# Patient Record
Sex: Female | Born: 1990 | Race: White | Hispanic: No | Smoking: Never smoker
Health system: Southern US, Community
[De-identification: ages and names within clinical notes are randomized; demographics above are authoritative.]

## PROBLEM LIST (undated history)

## (undated) DIAGNOSIS — F32A Depression, unspecified: Secondary | ICD-10-CM

## (undated) DIAGNOSIS — M5136 Other intervertebral disc degeneration, lumbar region: Secondary | ICD-10-CM

## (undated) DIAGNOSIS — O139 Gestational [pregnancy-induced] hypertension without significant proteinuria, unspecified trimester: Secondary | ICD-10-CM

## (undated) DIAGNOSIS — D332 Benign neoplasm of brain, unspecified: Secondary | ICD-10-CM

## (undated) DIAGNOSIS — G43909 Migraine, unspecified, not intractable, without status migrainosus: Secondary | ICD-10-CM

## (undated) DIAGNOSIS — K589 Irritable bowel syndrome without diarrhea: Secondary | ICD-10-CM

## (undated) DIAGNOSIS — M51369 Other intervertebral disc degeneration, lumbar region without mention of lumbar back pain or lower extremity pain: Secondary | ICD-10-CM

## (undated) DIAGNOSIS — F411 Generalized anxiety disorder: Secondary | ICD-10-CM

## (undated) DIAGNOSIS — O4590 Premature separation of placenta, unspecified, unspecified trimester: Secondary | ICD-10-CM

## (undated) DIAGNOSIS — M797 Fibromyalgia: Secondary | ICD-10-CM

## (undated) DIAGNOSIS — G4700 Insomnia, unspecified: Secondary | ICD-10-CM

## (undated) HISTORY — DX: Benign neoplasm of brain, unspecified: D33.2

## (undated) HISTORY — DX: Gestational (pregnancy-induced) hypertension without significant proteinuria, unspecified trimester: O13.9

## (undated) HISTORY — PX: WISDOM TOOTH EXTRACTION: SHX21

## (undated) HISTORY — PX: BACK SURGERY: SHX140

## (undated) HISTORY — DX: Premature separation of placenta, unspecified, unspecified trimester: O45.90

## (undated) HISTORY — PX: TUBAL LIGATION: SHX77

## (undated) HISTORY — DX: Migraine, unspecified, not intractable, without status migrainosus: G43.909

---
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
## 2012-06-22 ENCOUNTER — Encounter: Payer: Self-pay | Admitting: Cardiovascular Disease

## 2012-06-22 ENCOUNTER — Encounter: Payer: Self-pay | Admitting: *Deleted

## 2012-06-23 ENCOUNTER — Other Ambulatory Visit: Payer: Self-pay | Admitting: *Deleted

## 2012-06-23 ENCOUNTER — Encounter: Payer: Self-pay | Admitting: Cardiovascular Disease

## 2012-06-23 ENCOUNTER — Other Ambulatory Visit (HOSPITAL_COMMUNITY): Payer: Self-pay | Admitting: Family Medicine

## 2012-06-23 ENCOUNTER — Ambulatory Visit (INDEPENDENT_AMBULATORY_CARE_PROVIDER_SITE_OTHER): Payer: BC Managed Care – PPO | Admitting: Cardiovascular Disease

## 2012-06-23 ENCOUNTER — Ambulatory Visit (HOSPITAL_COMMUNITY): Payer: BC Managed Care – PPO | Attending: Family Medicine | Admitting: Radiology

## 2012-06-23 VITALS — BP 134/96 | HR 83 | Ht 70.0 in | Wt 185.4 lb

## 2012-06-23 DIAGNOSIS — R011 Cardiac murmur, unspecified: Secondary | ICD-10-CM

## 2012-06-23 NOTE — Progress Notes (Signed)
Echocardiogram performed.  

## 2012-06-23 NOTE — Progress Notes (Signed)
Patient ID: Heidi Gibbs, female   DOB: January 02, 1991, 21 y.o.   MRN: [id number] 21 yo with recent febrile illness On antibiotics till Monday.  PA at [facility] heard murmur and referred. No history of rheumatic fever, scarlet fever.  Tested negative for mono.  No history of congenital heart disease or murmur.  No diet pills or fen-phen.  Fever has resolved ? Sinusitis.  No rash No stigmata of SBE.  She denies dyspnea, palpitations, or chest pain.  I have no lab work on her but she denies pregnancy, anemia or thyroid disease.  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    ROS: Denies fever, malais, weight loss, blurry vision, decreased visual acuity, cough, sputum, SOB, hemoptysis, pleuritic pain, palpitaitons, heartburn, abdominal pain, melena, lower extremity edema, claudication, or rash.  All other systems reviewed and negative   General: Affect appropriate Healthy:  appears stated age HEENT: normal Neck supple with no adenopathy JVP normal no bruits no thyromegaly Lungs clear with no wheezing and good diaphragmatic motion Heart:  S1/S2 no murmur,rub, gallop or click PMI normal Abdomen: benighn, BS positve, no tenderness, no AAA no bruit.  No HSM or HJR Distal pulses intact with no bruits No edema Neuro non-focal Skin warm and dry No muscular weakness  Medications Current Outpatient Prescriptions  Medication Sig Dispense Refill  . MELATONIN FAST MELTZ PO Take by mouth.      . Multiple Vitamin (MULTIVITAMIN WITH MINERALS) TABS Take 1 tablet by mouth daily.      . norethindrone-ethinyl estradiol-iron (ESTROSTEP FE,TILIA FE,TRI-LEGEST FE) 1-20/1-30/1-35 MG-MCG tablet Take 1 tablet by mouth daily.      . vitamin C (ASCORBIC ACID) 500 MG tablet Take 500 mg by mouth daily.        Allergies Review of patient's allergies indicates no known allergies.  Family History: History reviewed. No pertinent family history.  Social History: History   Social History  . Marital Status: Single    Spouse  Name: N/A    Number of Children: N/A  . Years of Education: N/A   Occupational History  . Not on file.   Social History Main Topics  . Smoking status: Never Smoker   . Smokeless tobacco: Not on file  . Alcohol Use: Not on file  . Drug Use: Not on file  . Sexually Active: Not on file   Other Topics Concern  . Not on file   Social History Narrative  . No narrative on file    Electrocardiogram:  NSR rate 79 normal  Assessment and Plan

## 2012-06-23 NOTE — Assessment & Plan Note (Signed)
She has no murmur today.  Reviewed echo and she has trivial MR with no prolapse.  Doubt rheumatic fever.  Advised f/u echo in 2 years or sooner if murmur "comes" back or heard again.  Possibility of flow murmur heard during febrile illness

## 2012-06-23 NOTE — Patient Instructions (Addendum)
Your physician recommends that you schedule a follow-up appointment in: as needed Your physician recommends that you continue on your current medications as directed. Please refer to the Current Medication list given to you today.   

## 2012-06-27 ENCOUNTER — Encounter (HOSPITAL_COMMUNITY): Payer: Self-pay | Admitting: Family Medicine

## 2012-10-06 ENCOUNTER — Telehealth: Payer: Self-pay | Admitting: *Deleted

## 2012-10-06 MED ORDER — NORGESTIM-ETH ESTRAD TRIPHASIC 0.18/0.215/0.25 MG-35 MCG PO TABS: 1.0000 | ORAL_TABLET | Freq: Every day | ORAL | Status: DC

## 2012-10-06 NOTE — Telephone Encounter (Signed)
RX sent to [facility]

## 2012-10-06 NOTE — Telephone Encounter (Signed)
Pt needs refill on OCP. Could you please help me find in database to refill?  Will send chart back. Thanks

## 2012-10-09 ENCOUNTER — Telehealth: Payer: Self-pay | Admitting: Family Medicine

## 2012-10-09 NOTE — Telephone Encounter (Signed)
appt made for 4/8- pm clinic

## 2012-10-09 NOTE — Telephone Encounter (Signed)
Pt aware rx sent to [facility]

## 2012-10-10 ENCOUNTER — Ambulatory Visit (INDEPENDENT_AMBULATORY_CARE_PROVIDER_SITE_OTHER): Payer: BC Managed Care – PPO | Admitting: Nurse Practitioner

## 2012-10-10 VITALS — BP 131/83 | HR 106 | Temp 97.9°F | Ht 68.0 in | Wt 185.0 lb

## 2012-10-10 DIAGNOSIS — G44209 Tension-type headache, unspecified, not intractable: Secondary | ICD-10-CM

## 2012-10-10 DIAGNOSIS — R51 Headache: Secondary | ICD-10-CM

## 2012-10-10 MED ORDER — KETOROLAC TROMETHAMINE 60 MG/2ML IM SOLN
60.0000 mg | Freq: Once | INTRAMUSCULAR | Status: AC
  Administered 2012-10-10: 60 mg via INTRAMUSCULAR

## 2012-10-10 MED ORDER — BUTALBITAL-APAP-CAFF-COD 50-325-40-30 MG PO CAPS: 1.0000 | ORAL_CAPSULE | ORAL | Status: DC | PRN

## 2012-10-10 NOTE — Progress Notes (Deleted)
Error entry

## 2012-10-10 NOTE — Patient Instructions (Signed)
Tension Headache  A tension headache is a feeling of pain, pressure, or aching often felt over the front and sides of the head. The pain can be dull or can feel tight (constricting). It is the most common type of headache. Tension headaches are not normally associated with nausea or vomiting and do not get worse with physical activity. Tension headaches can last 30 minutes to several days.   CAUSES   The exact cause is not known, but it may be caused by chemicals and hormones in the brain that lead to pain. Tension headaches often begin after stress, anxiety, or depression. Other triggers may include:   Alcohol.   Caffeine (too much or withdrawal).   Respiratory infections (colds, flu, sinus infections).   Dental problems or teeth clenching.   Fatigue.   Holding your head and neck in one position too long while using a computer.  SYMPTOMS    Pressure around the head.    Dull, aching head pain.    Pain felt over the front and sides of the head.    Tenderness in the muscles of the head, neck, and shoulders.  DIAGNOSIS   A tension headache is often diagnosed based on:    Symptoms.    Physical examination.    A CT scan or MRI of your head. These tests may be ordered if symptoms are severe or unusual.  TREATMENT   Medicines may be given to help relieve symptoms.   HOME CARE INSTRUCTIONS    Only take over-the-counter or prescription medicines for pain or discomfort as directed by your caregiver.    Lie down in a dark, quiet room when you have a headache.    Keep a journal to find out what may be triggering your headaches. For example, write down:   What you eat and drink.   How much sleep you get.   Any change to your diet or medicines.   Try massage or other relaxation techniques.    Ice packs or heat applied to the head and neck can be used. Use these 3 to 4 times per day for 15 to 20 minutes each time, or as needed.    Limit stress.    Sit up straight, and do not tense your muscles.     Quit smoking if you smoke.   Limit alcohol use.   Decrease the amount of caffeine you drink, or stop drinking caffeine.   Eat and exercise regularly.   Get 7 to 9 hours of sleep, or as recommended by your caregiver.   Avoid excessive use of pain medicine as recurrent headaches can occur.   SEEK MEDICAL CARE IF:    You have problems with the medicines you were prescribed.   Your medicines do not work.   You have a change from the usual headache.   You have nausea or vomiting.  SEEK IMMEDIATE MEDICAL CARE IF:    Your headache becomes severe.   You have a fever.   You have a stiff neck.   You have loss of vision.   You have muscular weakness or loss of muscle control.   You lose your balance or have trouble walking.   You feel faint or pass out.   You have severe symptoms that are different from your first symptoms.  MAKE SURE YOU:    Understand these instructions.   Will watch your condition.   Will get help right away if you are not doing well or get worse.  

## 2012-10-10 NOTE — Progress Notes (Signed)
  Subjective:    Patient ID: Heidi Gibbs, female    DOB: 20-Jul-1990, 20 y.o.   MRN: [id number]  HPI-Patient in complaining of headaches . Started 2week. Has gotten worse since started. Associated symptoms include nausea and light sensativity. He has tried excerdin OTC without relief. Pate int has always had frequent headaches but not daily like she is having now. Hurts across front of head and describes it as pounding. Dull ache in back of head. Patient says she only has coffee 1X/week. Rates pain 7/10 at its worse.     Review of Systems  Constitutional: Negative.   Eyes: Positive for photophobia.  Respiratory: Negative.   Cardiovascular: Negative.   Gastrointestinal: Positive for nausea.  Genitourinary: Negative.   Neurological: Positive for headaches.       Objective:   Physical Exam  Constitutional: She is oriented to person, place, and time. She appears well-developed and well-nourished.  HENT:  Head: Normocephalic.  Nose: Nose normal.  Mouth/Throat: Oropharynx is clear and moist.  Eyes: Conjunctivae and EOM are normal. Pupils are equal, round, and reactive to light.  Neck: Normal range of motion. Neck supple.  Cardiovascular: Normal rate, normal heart sounds and intact distal pulses.   No murmur heard. Pulmonary/Chest: Effort normal and breath sounds normal.  Abdominal: Soft. Bowel sounds are normal.  Neurological: She is alert and oriented to person, place, and time. She has normal reflexes. No cranial nerve deficit.  Skin: Skin is warm and dry.  Psychiatric: She has a normal mood and affect. Her behavior is normal. Judgment and thought content normal.          Assessment & Plan:  Headache/tension headache  Toradol Inj  Fioricet with codeine  Force fluids  Rest  Keep HA diary  RTO prn  Mary-Margaret Daphine Deutscher, FNP

## 2012-10-12 ENCOUNTER — Other Ambulatory Visit: Payer: Self-pay | Admitting: *Deleted

## 2012-11-06 ENCOUNTER — Ambulatory Visit (INDEPENDENT_AMBULATORY_CARE_PROVIDER_SITE_OTHER): Payer: BC Managed Care – PPO | Admitting: General Practice

## 2012-11-06 VITALS — BP 148/86 | HR 78 | Temp 98.7°F | Ht 68.0 in | Wt 200.0 lb

## 2012-11-06 DIAGNOSIS — B019 Varicella without complication: Secondary | ICD-10-CM

## 2012-11-06 LAB — POCT CBC
HCT, POC: 38.6 % (ref 37.7–47.9)
Lymph, poc: 1.3 (ref 0.6–3.4)
MCHC: 37 g/dL — AB (ref 31.8–35.4)
MCV: 83.4 fL (ref 80–97)
MPV: 7.7 fL (ref 0–99.8)
POC Granulocyte: 3.3 (ref 2–6.9)
POC LYMPH PERCENT: 25.5 %L (ref 10–50)
WBC: 5 10*3/uL (ref 4.6–10.2)

## 2012-11-06 MED ORDER — FAMCICLOVIR 500 MG PO TABS: 500.0000 mg | ORAL_TABLET | Freq: Three times a day (TID) | ORAL | Status: DC

## 2012-11-06 NOTE — Progress Notes (Signed)
  Subjective:    Patient ID: Heidi Gibbs, female    DOB: Apr 08, 1991, 22 y.o.   MRN: [id number]  Rash This is a new problem. Episode onset: Friday. The problem has been gradually worsening since onset. The affected locations include the torso, left arm, right arm, right upper leg and right lower leg. The rash is characterized by blistering, itchiness and redness. She was exposed to nothing. Associated symptoms include a fever. Pertinent negatives include no shortness of breath. (101 maximum) Treatments tried: tylenol and motrin. The treatment provided mild relief. Her past medical history is significant for eczema. There is no history of allergies, asthma or varicella.  Fever  Associated symptoms include headaches and a rash. Pertinent negatives include no chest pain.      Review of Systems  Constitutional: Positive for fever.  HENT: Negative for mouth sores, neck pain and neck stiffness.   Respiratory: Negative for chest tightness and shortness of breath.   Cardiovascular: Negative for chest pain.  Gastrointestinal: Negative for abdominal distention.  Genitourinary: Negative for difficulty urinating.  Musculoskeletal: Positive for myalgias and back pain.       Since saturday  Skin: Positive for rash.  Neurological: Positive for dizziness and headaches.       Since Saturday night intermittently    Psychiatric/Behavioral: Negative.        Objective:   Physical Exam  Constitutional: She is oriented to person, place, and time. She appears well-developed and well-nourished.  HENT:  Head: Normocephalic and atraumatic.  Right Ear: External ear normal.  Left Ear: External ear normal.  Cardiovascular: Normal rate, regular rhythm, normal heart sounds and intact distal pulses.   No murmur heard. Pulmonary/Chest: Effort normal and breath sounds normal. No respiratory distress. She exhibits no tenderness.  Abdominal: Soft. Bowel sounds are normal.  Neurological: She is alert and  oriented to person, place, and time.  Skin: Skin is warm and dry. Lesion and rash noted. Rash is macular, papular and vesicular. There is erythema.  Psychiatric: She has a normal mood and affect.          Assessment & Plan:  Take medications as prescribed Cultures pending Keep fingernails clean and short Do not scratch  RTO if symptoms worsen Refrain from immunocompromised person, elderly, and children Patient verbalized understanding Raymon Mutton, FNP-C

## 2012-11-06 NOTE — Patient Instructions (Addendum)
Chickenpox in Adults Chickenpox is an illness caused by a virus. This virus can spread easily from one person to another. Those with chickenpox almost never get it more than once. While it usually strikes children, adults who have never had the illness or vaccine can get chickenpox. In children, the illness is reasonably mild, although annoying. In adults, it can be very serious. CAUSES  A varicella-zoster virus causes chickenpox. The virus is passed in tiny droplets that the infected person coughs or sneezes into the air. Chickenpox can also be passed when someone comes into contact with the fluid produced by the chickenpox rash. When someone has been exposed to chickenpox, he or she usually comes down with the illness within about 10 to 21 days.  SYMPTOMS  The illness usually starts with:  Body aches and pain.  Headache.  Irritability.  Tiredness.  Fever.  Sore throat. A day or two later, a rash develops. The rash is made up of very itchy blisters. The rash lasts about 5 to 7 days. Each "chicken pock" heals over with a crusty scab. Chickenpox is very serious illness in adults. There is a higher risk of complications, including:  Pneumonia.  Skin infection.  Bone infection (osteomyelitis).  Joint infection (septic arthritis).  Brain infection (encephalitis).  Toxic shock syndrome.  Bleeding problems.  Problems with balance and muscle control (cerebellar ataxia).  Death. Women who get chickenpox during pregnancy have a higher risk of having a baby with birth defects. DIAGNOSIS  A diagnosis is based on the presence of the usual symptoms of achiness and fever along with the characteristic rash. If there is any question, blood tests can be done to diagnose the infection. TREATMENT  Treatment for chickenpox may include:  Taking the anti-viral drug acyclovir to shorten the length of the illness and to decrease its severity. The drug has to be started within 24 hours of  symptoms.  Taking medicine as directed by your caregiver.  Applying calamine lotion to decrease itchiness.  Baking soda or oatmeal baths to soothe itchy skin. When a person who has never had chickenpox has been exposed to the virus (especially a pregnant woman or a patient with HIV or AIDS) they might benefit from a shot of varicella-zoster immune globulin (VZIG). This helps prevent the person from actually coming down with the illness. VZIG must be given within 72 hours of exposure to the virus. HOME CARE INSTRUCTIONS   Only take over-the-counter or prescriptions medicines for pain, discomfort, or fever as directed by your caregiver.  Try taking a lukewarm (not hot) bath every few hours. Adding several tablespoons of baking soda or oatmeal may help make the bath more soothing.  Ice packs or cold washcloths applied to the rash may help improve itching.  Ask your caregiver if you may use an over-the-counter antihistamine (such as diphenhydramine) to decrease itching.  Wash your hands often. This helps lower the risk of a bacterial skin infection, as well as passing the virus to others. If you can, use alcohol-based rubs or wipes. If you cannot get these, use regular soap and water.  If you have blisters in your mouth, do not eat or drink spicy, salty, or acidic things. Soft, bland, cold foods and beverages will feel best.  Avoid people who have not had chickenpox or women who are pregnant. SEEK IMMEDIATE MEDICAL CARE IF:   You have a hard time breathing.  You have a severe headache.  You have a stiff neck.  You have severe   joint pain or stiffness.  You feel disoriented or confused.  You are having trouble walking or keeping your balance.  You have an oral temperature above 102 F (38.9 C).  The area around one of the chickenpox becomes very red, hot to the touch, painful, or leaks pus. Document Released: 03/30/2008 Document Revised: 09/13/2011 Document Reviewed:  03/30/2008 Exit[employer] Patient Information 2013 [employer].  

## 2012-11-07 LAB — VARICELLA ZOSTER ANTIBODY, IGG

## 2012-11-20 LAB — VIRAL CULTURE VIRC: Organism ID, Bacteria: NEGATIVE

## 2012-12-29 ENCOUNTER — Other Ambulatory Visit: Payer: Self-pay | Admitting: General Practice

## 2012-12-29 ENCOUNTER — Telehealth: Payer: Self-pay | Admitting: General Practice

## 2012-12-29 ENCOUNTER — Ambulatory Visit (INDEPENDENT_AMBULATORY_CARE_PROVIDER_SITE_OTHER): Payer: BC Managed Care – PPO | Admitting: General Practice

## 2012-12-29 ENCOUNTER — Ambulatory Visit (HOSPITAL_COMMUNITY)
Admission: RE | Admit: 2012-12-29 | Discharge: 2012-12-29 | Disposition: A | Discharge status: Home / Self Care | Payer: BC Managed Care – PPO | Source: Ambulatory Visit | Attending: General Practice | Admitting: General Practice

## 2012-12-29 VITALS — BP 114/62 | HR 76 | Temp 97.4°F | Ht 69.0 in | Wt 180.0 lb

## 2012-12-29 DIAGNOSIS — H6120 Impacted cerumen, unspecified ear: Secondary | ICD-10-CM

## 2012-12-29 DIAGNOSIS — R42 Dizziness and giddiness: Secondary | ICD-10-CM | POA: Insufficient documentation

## 2012-12-29 DIAGNOSIS — R112 Nausea with vomiting, unspecified: Secondary | ICD-10-CM | POA: Insufficient documentation

## 2012-12-29 DIAGNOSIS — H6123 Impacted cerumen, bilateral: Secondary | ICD-10-CM

## 2012-12-29 LAB — POCT CBC
Granulocyte percent: 78.7 %G (ref 37–80)
HCT, POC: 41.7 % (ref 37.7–47.9)
Lymph, poc: 2 (ref 0.6–3.4)
MCH, POC: 29.4 pg (ref 27–31.2)
MCHC: 34.5 g/dL (ref 31.8–35.4)
MCV: 85.2 fL (ref 80–97)
POC Granulocyte: 9.3 — AB (ref 2–6.9)
POC LYMPH PERCENT: 16.8 %L (ref 10–50)
RDW, POC: 13.5 %
WBC: 11.8 10*3/uL — AB (ref 4.6–10.2)

## 2012-12-29 LAB — POCT URINALYSIS DIPSTICK
Bilirubin, UA: NEGATIVE
Ketones, UA: NEGATIVE
Nitrite, UA: NEGATIVE
Spec Grav, UA: <=1.005

## 2012-12-29 LAB — POCT UA - MICROSCOPIC ONLY
Crystals, Ur, HPF, POC: NEGATIVE
Mucus, UA: NEGATIVE
Yeast, UA: NEGATIVE

## 2012-12-29 LAB — POCT URINE PREGNANCY: Preg Test, Ur: NEGATIVE

## 2012-12-29 MED ORDER — ONDANSETRON HCL 4 MG PO TABS: 4.0000 mg | ORAL_TABLET | Freq: Three times a day (TID) | ORAL | Status: DC | PRN

## 2012-12-29 MED ORDER — PROMETHAZINE HCL 25 MG/ML IJ SOLN
25.0000 mg | Freq: Once | INTRAMUSCULAR | Status: AC
Start: 2012-12-29 — End: 2012-12-29
  Administered 2012-12-29: 25 mg via INTRAMUSCULAR

## 2012-12-29 MED ORDER — AMOXICILLIN 500 MG PO CAPS: 500.0000 mg | ORAL_CAPSULE | Freq: Two times a day (BID) | ORAL | Status: DC

## 2012-12-29 MED ORDER — BUTALBITAL-APAP-CAFF-COD 50-325-40-30 MG PO CAPS: 1.0000 | ORAL_CAPSULE | ORAL | Status: DC | PRN

## 2012-12-29 NOTE — Progress Notes (Signed)
Subjective:    Patient ID: Heidi Gibbs, female    DOB: June 27, 1991, 22 y.o.   MRN: [id number]  HPI Presents today complaining of nausea, vomiting, and headache. She reports having a history of migraines. She denies taking migraine medications this am. She reports vomiting four times this am.  Reports starting menstrual cycle on yesterday. Reports waking up this morning with dizziness, then started vomiting after being up for 30 minutes.    Review of Systems  Constitutional: Negative for fever and chills.  HENT: Negative for ear pain, congestion, rhinorrhea, neck pain, neck stiffness and postnasal drip.   Eyes: Negative for photophobia and visual disturbance.  Respiratory: Negative for chest tightness, shortness of breath and wheezing.   Cardiovascular: Negative for chest pain and palpitations.  Gastrointestinal: Negative for abdominal pain.  Genitourinary: Negative for dysuria, hematuria and difficulty urinating.  Musculoskeletal: Positive for back pain.  Neurological: Positive for dizziness, weakness and headaches.       Objective:   Physical Exam  Constitutional: She appears well-developed and well-nourished.  HENT:  Head: Normocephalic and atraumatic.  Cerumen noted to bilateral ear canal, TM not visible.   Cardiovascular: Normal rate, regular rhythm and normal heart sounds.   Pulmonary/Chest: Effort normal and breath sounds normal. No respiratory distress. She exhibits no tenderness.  Abdominal: Soft. Bowel sounds are normal. She exhibits no distension. There is no tenderness.  Skin: Skin is warm and dry.  Psychiatric: She has a normal mood and affect.   Results for orders placed in visit on 12/29/12  POCT URINALYSIS DIPSTICK      Result Value Range   Color, UA yellow     Clarity, UA cloudy     Glucose, UA neg     Bilirubin, UA neg     Ketones, UA neg     Spec Grav, UA <=1.005     Blood, UA mod     pH, UA 8.0     Protein, UA neg     Urobilinogen, UA negative      Nitrite, UA neg     Leukocytes, UA Trace    POCT UA - MICROSCOPIC ONLY      Result Value Range   WBC, Ur, HPF, POC 5-10     RBC, urine, microscopic 10-15     Bacteria, U Microscopic neg     Mucus, UA neg     Epithelial cells, urine per micros occ     Crystals, Ur, HPF, POC neg     Casts, Ur, LPF, POC neg     Yeast, UA neg    POCT URINE PREGNANCY      Result Value Range   Preg Test, Ur Negative    POCT CBC      Result Value Range   WBC 11.8 (*) 4.6 - 10.2 K/uL   Lymph, poc 2.0  0.6 - 3.4   POC LYMPH PERCENT 16.8  10 - 50 %L   MID (cbc)    0 - 0.9   POC MID %    0 - 12 %M   POC Granulocyte 9.3 (*) 2 - 6.9   Granulocyte percent 78.7  37 - 80 %G   RBC 4.9  4.04 - 5.48 M/uL   Hemoglobin 14.4  12.2 - 16.2 g/dL   HCT, POC 78.2  95.6 - 47.9 %   MCV 85.2  80 - 97 fL   MCH, POC 29.4  27 - 31.2 pg   MCHC 34.5  31.8 - 35.4 g/dL  RDW, POC 13.5     Platelet Count, POC 270.0  142 - 424 K/uL   MPV 7.5  0 - 99.8 fL          Assessment & Plan:  1. Nausea with vomiting and 2. Dizziness and giddiness  - promethazine (PHENERGAN) injection 25 mg; Inject 1 mL (25 mg total) into the muscle once. - POCT urinalysis dipstick - POCT UA - Microscopic Only - POCT urine pregnancy - POCT CBC - COMPLETE METABOLIC PANEL WITH GFR - CT Head Wo Contrast -Patient's friend present and will transport patient to [facility] for CT scan  3. Cerumen impaction, bilateral -irrigation of bilateral ears (moderate amount of cerumen removed from left ear, small amount from right ear, more tenderness noted) -RTO if symptoms worsen -Patient verbalized understanding -Coralie Keens, FNP-C

## 2012-12-29 NOTE — Patient Instructions (Addendum)

## 2013-01-01 ENCOUNTER — Telehealth: Payer: Self-pay | Admitting: *Deleted

## 2013-01-01 NOTE — Telephone Encounter (Signed)
Aware,   Fioricet,  rx ready.

## 2013-02-26 ENCOUNTER — Ambulatory Visit: Payer: Self-pay | Admitting: General Practice

## 2013-04-16 ENCOUNTER — Ambulatory Visit (INDEPENDENT_AMBULATORY_CARE_PROVIDER_SITE_OTHER): Payer: BC Managed Care – PPO

## 2013-04-16 DIAGNOSIS — Z23 Encounter for immunization: Secondary | ICD-10-CM

## 2013-09-28 ENCOUNTER — Encounter: Payer: Self-pay | Admitting: Physician Assistant

## 2013-09-28 ENCOUNTER — Ambulatory Visit (INDEPENDENT_AMBULATORY_CARE_PROVIDER_SITE_OTHER): Payer: BC Managed Care – PPO | Admitting: Physician Assistant

## 2013-09-28 VITALS — BP 129/78 | HR 85 | Temp 98.3°F | Ht 69.0 in | Wt 231.0 lb

## 2013-09-28 DIAGNOSIS — M79609 Pain in unspecified limb: Secondary | ICD-10-CM

## 2013-09-28 DIAGNOSIS — M79671 Pain in right foot: Secondary | ICD-10-CM

## 2013-09-28 MED ORDER — MELOXICAM 7.5 MG PO TABS: 7.5000 mg | ORAL_TABLET | Freq: Every day | ORAL | Status: DC

## 2013-10-16 NOTE — Progress Notes (Signed)
   Subjective:    Patient ID: Heidi Gibbs, female    DOB: 06-28-1991, 23 y.o.   MRN: [id number]  HPI 23 y/o female presents with ankle/foot pain that has been intermittent since she dropped a freezer on it two years ago. No fracture at that time. Worse with walking. Better with rest. Associated popping in ankle. No referred pain, numbness or tingling. Has taken a few ibuprofen with minimal relief.    Review of Systems  Musculoskeletal: Positive for arthralgias and joint swelling (occasional in ankle).       No redness, burning, stinging, numbness, tingling, weakness or inability to perform AROM  Skin: Negative.        Objective:   Physical Exam  Musculoskeletal: Normal range of motion. She exhibits no edema and no tenderness.  Full AROM, no "popping" heard with walking No TTP, no weakness  Skin: No rash noted. No erythema. No pallor.          Assessment & Plan:  1. Ankle sprain/strain: Rx Mobic 7.5 mg q day in addition to ankle brace for support until f/u in 3 wks. RTC if s/s worsenn or DNI. Due to PE and lack of ecchymosis, edema and TTP, I do not feel that an xray was necessary today. Rest, ice, compression, elevation

## 2013-10-19 ENCOUNTER — Encounter: Payer: Self-pay | Admitting: Physician Assistant

## 2013-10-19 ENCOUNTER — Ambulatory Visit (INDEPENDENT_AMBULATORY_CARE_PROVIDER_SITE_OTHER): Payer: BC Managed Care – PPO | Admitting: Physician Assistant

## 2013-10-19 VITALS — BP 141/88 | HR 96 | Temp 98.3°F | Ht 70.0 in | Wt 231.5 lb

## 2013-10-19 DIAGNOSIS — M25579 Pain in unspecified ankle and joints of unspecified foot: Secondary | ICD-10-CM

## 2013-10-19 DIAGNOSIS — M25571 Pain in right ankle and joints of right foot: Secondary | ICD-10-CM

## 2013-10-19 NOTE — Progress Notes (Signed)
Patient ID: Heidi Gibbs, female   DOB: [birth year], 23 y.o.   MRN: [id number] S. 23 y/o female presents for intermittent pain in R ankle that radiates up calf after standing. Has been worsening over the past 2 months but began a couple years ago when she dropped a freezer on her foot at work. Has tried wearing a brace for the past month along with Mobic once daily with no relief. 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    O:  Denies swelling, redness, numbness, tingling, burning weakness or more recent trauma in RLE.  Endorses pain in ankle that radiates up her calf after standing for long periods of time or with dorsiflexion. Describes pain as a "shooting pain" that is intermittent and worse with walking.   A:  Right ankle pain: Nonedematous RLE. Non tender to palpation. Pain with passive dorsiflexion of right ankle that radiates up the calf and is relieved with release. No ecchymosis or erythema. Same size in comparison when compared to LLE.   P:  Will refer to ortho due to no improvement with wearing of brace and tx w/ Mobic. I do not feel that an xray is needed, however, an MRI may show more soft tissue involvement. I have explained to patient that stretching and exercise may be beneficial. Patient prefers referral to [facility]. Continue with Mobic until seen.

## 2013-11-09 ENCOUNTER — Other Ambulatory Visit (HOSPITAL_COMMUNITY): Payer: Self-pay | Admitting: Orthopedic Surgery

## 2013-11-09 ENCOUNTER — Other Ambulatory Visit: Payer: Self-pay | Admitting: Orthopedic Surgery

## 2013-11-09 DIAGNOSIS — M545 Low back pain, unspecified: Secondary | ICD-10-CM

## 2013-11-14 ENCOUNTER — Ambulatory Visit (HOSPITAL_COMMUNITY)
Admission: RE | Admit: 2013-11-14 | Discharge: 2013-11-14 | Disposition: A | Discharge status: Home / Self Care | Payer: BC Managed Care – PPO | Source: Ambulatory Visit | Attending: Orthopedic Surgery | Admitting: Orthopedic Surgery

## 2013-11-14 DIAGNOSIS — M545 Low back pain, unspecified: Secondary | ICD-10-CM

## 2013-11-14 DIAGNOSIS — M5137 Other intervertebral disc degeneration, lumbosacral region: Secondary | ICD-10-CM | POA: Insufficient documentation

## 2013-11-14 DIAGNOSIS — M51379 Other intervertebral disc degeneration, lumbosacral region without mention of lumbar back pain or lower extremity pain: Secondary | ICD-10-CM | POA: Insufficient documentation

## 2013-11-14 DIAGNOSIS — M5126 Other intervertebral disc displacement, lumbar region: Secondary | ICD-10-CM | POA: Insufficient documentation

## 2013-11-15 ENCOUNTER — Other Ambulatory Visit: Payer: BC Managed Care – PPO

## 2014-01-01 ENCOUNTER — Ambulatory Visit: Payer: BC Managed Care – PPO | Attending: Anesthesiology | Admitting: Physical Therapy

## 2014-01-01 DIAGNOSIS — IMO0002 Reserved for concepts with insufficient information to code with codable children: Secondary | ICD-10-CM | POA: Insufficient documentation

## 2014-01-01 DIAGNOSIS — M545 Low back pain, unspecified: Secondary | ICD-10-CM | POA: Diagnosis not present

## 2014-01-01 DIAGNOSIS — IMO0001 Reserved for inherently not codable concepts without codable children: Secondary | ICD-10-CM | POA: Diagnosis present

## 2014-01-01 DIAGNOSIS — R5381 Other malaise: Secondary | ICD-10-CM | POA: Diagnosis not present

## 2014-01-08 ENCOUNTER — Ambulatory Visit: Payer: BC Managed Care – PPO | Attending: Anesthesiology | Admitting: Physical Therapy

## 2014-01-08 DIAGNOSIS — M545 Low back pain, unspecified: Secondary | ICD-10-CM | POA: Diagnosis not present

## 2014-01-08 DIAGNOSIS — IMO0002 Reserved for concepts with insufficient information to code with codable children: Secondary | ICD-10-CM | POA: Insufficient documentation

## 2014-01-08 DIAGNOSIS — R5381 Other malaise: Secondary | ICD-10-CM | POA: Insufficient documentation

## 2014-01-08 DIAGNOSIS — IMO0001 Reserved for inherently not codable concepts without codable children: Secondary | ICD-10-CM | POA: Insufficient documentation

## 2014-01-10 ENCOUNTER — Ambulatory Visit: Payer: BC Managed Care – PPO | Admitting: Physical Therapy

## 2014-01-10 DIAGNOSIS — IMO0001 Reserved for inherently not codable concepts without codable children: Secondary | ICD-10-CM | POA: Diagnosis not present

## 2014-01-15 ENCOUNTER — Ambulatory Visit: Payer: BC Managed Care – PPO | Admitting: Physical Therapy

## 2014-01-15 DIAGNOSIS — IMO0001 Reserved for inherently not codable concepts without codable children: Secondary | ICD-10-CM | POA: Diagnosis not present

## 2014-01-17 ENCOUNTER — Ambulatory Visit: Payer: BC Managed Care – PPO | Admitting: Physical Therapy

## 2014-01-17 DIAGNOSIS — IMO0001 Reserved for inherently not codable concepts without codable children: Secondary | ICD-10-CM | POA: Diagnosis not present

## 2014-01-29 ENCOUNTER — Ambulatory Visit: Payer: BC Managed Care – PPO | Admitting: Physical Therapy

## 2014-01-29 DIAGNOSIS — IMO0001 Reserved for inherently not codable concepts without codable children: Secondary | ICD-10-CM | POA: Diagnosis not present

## 2014-01-31 ENCOUNTER — Encounter: Payer: BC Managed Care – PPO | Admitting: Physical Therapy

## 2014-03-29 DIAGNOSIS — R011 Cardiac murmur, unspecified: Secondary | ICD-10-CM | POA: Insufficient documentation

## 2014-04-10 ENCOUNTER — Ambulatory Visit (INDEPENDENT_AMBULATORY_CARE_PROVIDER_SITE_OTHER): Payer: BC Managed Care – PPO

## 2014-04-10 DIAGNOSIS — Z23 Encounter for immunization: Secondary | ICD-10-CM

## 2015-01-28 ENCOUNTER — Encounter: Payer: Self-pay | Admitting: Family Medicine

## 2015-01-28 ENCOUNTER — Ambulatory Visit (INDEPENDENT_AMBULATORY_CARE_PROVIDER_SITE_OTHER): Payer: BLUE CROSS/BLUE SHIELD | Admitting: Family Medicine

## 2015-01-28 VITALS — BP 140/88 | HR 99 | Temp 98.8°F | Ht 70.0 in | Wt 199.0 lb

## 2015-01-28 DIAGNOSIS — F411 Generalized anxiety disorder: Secondary | ICD-10-CM | POA: Diagnosis not present

## 2015-01-28 MED ORDER — SERTRALINE HCL 50 MG PO TABS: ORAL_TABLET | ORAL | Status: DC

## 2015-01-28 NOTE — Progress Notes (Signed)
   Subjective:    Patient ID: Heidi Gibbs, female    DOB: June 07, 1991, 24 y.o.   MRN: [id number]  HPI  patient presents today for treatment of anxiety. She is seeing a psychologist who has diagnosed anxiety but apparently not panic disorder or depression. Psychologist has suggested Zoloft and that's what patient would like to try. Her symptoms are somewhat suggestive that she could have panic disorder and early agoraphobia. She also has difficulty sleeping which could be related to some depression as well as anxiety. Patient Active Problem List   Diagnosis Date Noted  . Murmur    Outpatient Encounter Prescriptions as of 01/28/2015  Medication Sig  . Norgestimate-Ethinyl Estradiol Triphasic 0.18/0.215/0.25 MG-35 MCG tablet Take 1 tablet by mouth daily.  . [DISCONTINUED] butalbital-acetaminophen-caffeine (FIORICET/CODEINE) 50-325-40-30 MG per capsule Take 1 capsule by mouth every 4 (four) hours as needed for headache.  . [DISCONTINUED] MELATONIN FAST MELTZ PO Take by mouth.  . [DISCONTINUED] meloxicam (MOBIC) 7.5 MG tablet Take 1 tablet (7.5 mg total) by mouth daily.  . [DISCONTINUED] ondansetron (ZOFRAN) 4 MG tablet Take 1 tablet (4 mg total) by mouth every 8 (eight) hours as needed for nausea.  . [DISCONTINUED] vitamin C (ASCORBIC ACID) 500 MG tablet Take 500 mg by mouth daily.   No facility-administered encounter medications on file as of 01/28/2015.      Review of Systems  Psychiatric/Behavioral: Positive for sleep disturbance. The patient is nervous/anxious.        Objective:   Physical Exam  Constitutional: She is oriented to person, place, and time. She appears well-developed and well-nourished.  Neurological: She is alert and oriented to person, place, and time.  Psychiatric: She has a normal mood and affect. Her behavior is normal. Judgment and thought content normal.     BP 140/88 mmHg  Pulse 99  Temp(Src) 98.8 F (37.1 C) (Oral)  Ht 5\' 10"  (1.778 m)  Wt 199 lb  (90.266 kg)  BMI 28.55 kg/m2     Assessment & Plan:  1. Generalized anxiety disorder We'll begin Zoloft at 25 mg a day for the first week advancing to 50 mg the second week. There is no clear-cut depression or panic disorder but the Zoloft might also help if those are part of the problem. Continue this follow-up with psychologist and plan on returning here in about a month  Wardell Honour MD

## 2015-02-17 ENCOUNTER — Encounter: Payer: Self-pay | Admitting: *Deleted

## 2015-03-11 ENCOUNTER — Ambulatory Visit: Payer: BLUE CROSS/BLUE SHIELD | Admitting: Family Medicine

## 2015-03-14 ENCOUNTER — Ambulatory Visit: Payer: BLUE CROSS/BLUE SHIELD | Admitting: Family Medicine

## 2015-04-02 ENCOUNTER — Ambulatory Visit (INDEPENDENT_AMBULATORY_CARE_PROVIDER_SITE_OTHER): Payer: BLUE CROSS/BLUE SHIELD | Admitting: Family Medicine

## 2015-04-02 ENCOUNTER — Encounter: Payer: Self-pay | Admitting: Family Medicine

## 2015-04-02 VITALS — BP 141/94 | HR 105 | Temp 97.9°F | Ht 70.0 in | Wt 200.0 lb

## 2015-04-02 DIAGNOSIS — R233 Spontaneous ecchymoses: Secondary | ICD-10-CM

## 2015-04-02 DIAGNOSIS — R238 Other skin changes: Secondary | ICD-10-CM | POA: Diagnosis not present

## 2015-04-02 DIAGNOSIS — Z23 Encounter for immunization: Secondary | ICD-10-CM

## 2015-04-02 DIAGNOSIS — R6889 Other general symptoms and signs: Secondary | ICD-10-CM

## 2015-04-02 DIAGNOSIS — L659 Nonscarring hair loss, unspecified: Secondary | ICD-10-CM | POA: Diagnosis not present

## 2015-04-02 NOTE — Progress Notes (Signed)
   Subjective:    Patient ID: Heidi Gibbs, female    DOB: Jun 28, 1991, 24 y.o.   MRN: [id number]  HPI Patient here today for follow up on meds including zoloft. She has develpoed some new issues since starting the medication.  Generally she feels much better on Zoloft. She has developed some symptoms such as easy bruising hair falling out and heat intolerance but I do not see these as symptoms of Zoloft. She also complains of some tiredness. She is sleeping better getting 6-7 hours of sleep per night       Patient Active Problem List   Diagnosis Date Noted  . Generalized anxiety disorder 01/28/2015  . Murmur    Outpatient Encounter Prescriptions as of 04/02/2015  Medication Sig  . Norgestimate-Ethinyl Estradiol Triphasic 0.18/0.215/0.25 MG-35 MCG tablet Take 1 tablet by mouth daily.  . sertraline (ZOLOFT) 50 MG tablet Take 1/2 tablet mid morning for 1 week and increase to 1 whole tablet if needed.   No facility-administered encounter medications on file as of 04/02/2015.     Review of Systems  Constitutional: Negative.   HENT: Negative.   Eyes: Negative.   Respiratory: Negative.   Cardiovascular: Negative.   Gastrointestinal: Negative.   Endocrine: Negative.        More heat and sweating than usual Hair loss bruising easily  Genitourinary: Negative.   Musculoskeletal: Negative.   Skin: Negative.   Allergic/Immunologic: Negative.   Neurological: Negative.   Hematological: Negative.   Psychiatric/Behavioral: Negative.        Objective:   Physical Exam  Constitutional: She is oriented to person, place, and time. She appears well-developed and well-nourished.  HENT:  Head: Normocephalic.  Eyes: Pupils are equal, round, and reactive to light.  Cardiovascular: Normal rate and regular rhythm.   Pulmonary/Chest: Effort normal and breath sounds normal.  Neurological: She is alert and oriented to person, place, and time. She has normal reflexes.  Psychiatric: She  has a normal mood and affect.   BP 141/94 mmHg  Pulse 105  Temp(Src) 97.9 F (36.6 C) (Oral)  Ht $R'5\' 10"'wR$  (1.778 m)  Wt 200 lb (90.719 kg)  BMI 28.70 kg/m2  LMP 03/12/2015        Assessment & Plan:  1. Encounter for immunization   2. Hair loss Will check thyroid . If symptoms persist to need dermatologic consult - CBC with Differential/Platelet - CMP14+EGFR - TSH  3. Bruises easily Check CBC and platelet count - CBC with Differential/Platelet - CMP14+EGFR - TSH  4. Heat sensitivity Etiology unknown we'll do screening labs - CBC with Differential/Platelet - CMP14+EGFR - TSH  Wardell Honour MD

## 2015-04-03 LAB — CBC WITH DIFFERENTIAL/PLATELET
BASOS ABS: 0.1 10*3/uL (ref 0.0–0.2)
Basos: 1 %
EOS (ABSOLUTE): 0.3 10*3/uL (ref 0.0–0.4)
Eos: 3 %
HEMOGLOBIN: 13.9 g/dL (ref 11.1–15.9)
Hematocrit: 42.6 % (ref 34.0–46.6)
IMMATURE GRANS (ABS): 0 10*3/uL (ref 0.0–0.1)
IMMATURE GRANULOCYTES: 0 %
LYMPHS: 31 %
Lymphocytes Absolute: 3.2 10*3/uL — ABNORMAL HIGH (ref 0.7–3.1)
MCH: 28.2 pg (ref 26.6–33.0)
MCHC: 32.6 g/dL (ref 31.5–35.7)
MCV: 86 fL (ref 79–97)
MONOCYTES: 8 %
Monocytes Absolute: 0.9 10*3/uL (ref 0.1–0.9)
NEUTROS PCT: 57 %
Neutrophils Absolute: 5.9 10*3/uL (ref 1.4–7.0)
PLATELETS: 368 10*3/uL (ref 150–379)
RBC: 4.93 x10E6/uL (ref 3.77–5.28)
RDW: 13.3 % (ref 12.3–15.4)
WBC: 10.3 10*3/uL (ref 3.4–10.8)

## 2015-04-03 LAB — CMP14+EGFR
ALBUMIN: 4.1 g/dL (ref 3.5–5.5)
ALT: 11 IU/L (ref 0–32)
AST: 14 IU/L (ref 0–40)
Albumin/Globulin Ratio: 1.4 (ref 1.1–2.5)
Alkaline Phosphatase: 79 IU/L (ref 39–117)
BUN/Creatinine Ratio: 16 (ref 8–20)
BUN: 10 mg/dL (ref 6–20)
CALCIUM: 9.8 mg/dL (ref 8.7–10.2)
CHLORIDE: 99 mmol/L (ref 97–108)
CO2: 25 mmol/L (ref 18–29)
CREATININE: 0.62 mg/dL (ref 0.57–1.00)
GFR calc non Af Amer: 129 mL/min/{1.73_m2} (ref >59)
GFR, EST AFRICAN AMERICAN: 149 mL/min/{1.73_m2} (ref >59)
GLUCOSE: 56 mg/dL — AB (ref 65–99)
Globulin, Total: 2.9 g/dL (ref 1.5–4.5)
Potassium: 4.6 mmol/L (ref 3.5–5.2)
Sodium: 138 mmol/L (ref 134–144)
TOTAL PROTEIN: 7 g/dL (ref 6.0–8.5)

## 2015-04-03 LAB — TSH: TSH: 1 u[IU]/mL (ref 0.450–4.500)

## 2015-04-07 ENCOUNTER — Telehealth: Payer: Self-pay | Admitting: Family Medicine

## 2015-04-09 NOTE — Telephone Encounter (Signed)
Patients mother aware of lab results.

## 2015-04-27 ENCOUNTER — Other Ambulatory Visit: Payer: Self-pay | Admitting: Family Medicine

## 2015-06-24 ENCOUNTER — Other Ambulatory Visit: Payer: Self-pay | Admitting: Family Medicine

## 2015-06-24 ENCOUNTER — Telehealth: Payer: Self-pay | Admitting: Family Medicine

## 2015-06-24 MED ORDER — SERTRALINE HCL 50 MG PO TABS: ORAL_TABLET | ORAL | Status: DC

## 2015-06-24 NOTE — Telephone Encounter (Signed)
Done, called mom and they changed to [facility]

## 2015-07-29 ENCOUNTER — Ambulatory Visit (INDEPENDENT_AMBULATORY_CARE_PROVIDER_SITE_OTHER): Payer: BLUE CROSS/BLUE SHIELD | Admitting: Family Medicine

## 2015-07-29 ENCOUNTER — Encounter: Payer: Self-pay | Admitting: Family Medicine

## 2015-07-29 VITALS — BP 124/73 | HR 83 | Temp 98.3°F | Ht 70.0 in | Wt 212.8 lb

## 2015-07-29 DIAGNOSIS — G43909 Migraine, unspecified, not intractable, without status migrainosus: Secondary | ICD-10-CM | POA: Insufficient documentation

## 2015-07-29 DIAGNOSIS — G479 Sleep disorder, unspecified: Secondary | ICD-10-CM | POA: Diagnosis not present

## 2015-07-29 DIAGNOSIS — F411 Generalized anxiety disorder: Secondary | ICD-10-CM | POA: Diagnosis not present

## 2015-07-29 DIAGNOSIS — G43809 Other migraine, not intractable, without status migrainosus: Secondary | ICD-10-CM | POA: Diagnosis not present

## 2015-07-29 MED ORDER — TRAZODONE HCL 100 MG PO TABS: 50.0000 mg | ORAL_TABLET | Freq: Every day | ORAL | Status: DC

## 2015-07-29 MED ORDER — SERTRALINE HCL 50 MG PO TABS: ORAL_TABLET | ORAL | Status: DC

## 2015-07-29 NOTE — Progress Notes (Signed)
   HPI  Patient presents today  Today to follow-up for anxiety headcs  andsleep ificuty.  Anxiety Dong very  Well on Zoloft No HA prior to last 4 weeks No Gi upset No depression   HA Central dull achy HA with insidious onset over the last 2-3 weeks has been nearly daily + Photophobia and queasy feeling in stomach, no emesis, no aura Drinks lots of caffeine Has just started a Restaurant manager, fast food program for criminology at [employer]   sleeping ifficulty Has trouble falling asleep and staying asleep at times Has been 4 weeks or so + caffeine   PMH: Smoking status noted ROS: Per HPI  Objective: BP 124/73 mmHg  Pulse 83  Temp(Src) 98.3 F (36.8 C) (Oral)  Ht 5\' 10"  (1.778 m)  Wt 212 lb 12.8 oz (96.525 kg)  BMI 30.53 kg/m2 Gen: NAD, alert, cooperative with exam HEENT: NCAT, PERRLA, EOMI CV: RRR, good S1/S2, no murmur Resp: CTABL, no wheezes, non-labored Ext: No edema, warm Neuro: Alert and oriented, Strngth 5/5 and sesnation intact in all 4 extremities, CN 2-12 intact  Assessment and plan:  # GAD Doing well on zoloft, doubt that HA is SE.  Refill  # HA's, Migraine Possibly due to sleep, caffiene, stress with grad schoool Work on sleep first, reduce caffeine Trazodone  # Insomnia Trazodone Decrease caffeine     Meds ordered this encounter  Medications  . traZODone (DESYREL) 100 MG tablet    Sig: Take 0.5-1 tablets (50-100 mg total) by mouth at bedtime.    Dispense:  30 tablet    Refill:  3  . sertraline (ZOLOFT) 50 MG tablet    Sig: Take one po qd    Dispense:  90 tablet    Refill:  McCoy, MD [facility] 07/29/2015, 8:34 AM

## 2015-07-29 NOTE — Patient Instructions (Signed)
Great to meet you!  Come back in 1 month if your headaches and/or sleep are not improving.   We have lots of options, we just want to take clear steps n the right direction.      Migraine Headache A migraine headache is an intense, throbbing pain on one or both sides of your head. A migraine can last for 30 minutes to several hours. CAUSES  The exact cause of a migraine headache is not always known. However, a migraine may be caused when nerves in the brain become irritated and release chemicals that cause inflammation. This causes pain. Certain things may also trigger migraines, such as:  Alcohol.  Smoking.  Stress.  Menstruation.  Aged cheeses.  Foods or drinks that contain nitrates, glutamate, aspartame, or tyramine.  Lack of sleep.  Chocolate.  Caffeine.  Hunger.  Physical exertion.  Fatigue.  Medicines used to treat chest pain (nitroglycerine), birth control pills, estrogen, and some blood pressure medicines. SIGNS AND SYMPTOMS  Pain on one or both sides of your head.  Pulsating or throbbing pain.  Severe pain that prevents daily activities.  Pain that is aggravated by any physical activity.  Nausea, vomiting, or both.  Dizziness.  Pain with exposure to bright lights, loud noises, or activity.  General sensitivity to bright lights, loud noises, or smells. Before you get a migraine, you may get warning signs that a migraine is coming (aura). An aura may include:  Seeing flashing lights.  Seeing bright spots, halos, or zigzag lines.  Having tunnel vision or blurred vision.  Having feelings of numbness or tingling.  Having trouble talking.  Having muscle weakness. DIAGNOSIS  A migraine headache is often diagnosed based on:  Symptoms.  Physical exam.  A CT scan or MRI of your head. These imaging tests cannot diagnose migraines, but they can help rule out other causes of headaches. TREATMENT Medicines may be given for pain and nausea.  Medicines can also be given to help prevent recurrent migraines.  HOME CARE INSTRUCTIONS  Only take over-the-counter or prescription medicines for pain or discomfort as directed by your health care provider. The use of long-term narcotics is not recommended.  Lie down in a dark, quiet room when you have a migraine.  Keep a journal to find out what may trigger your migraine headaches. For example, write down:  What you eat and drink.  How much sleep you get.  Any change to your diet or medicines.  Limit alcohol consumption.  Quit smoking if you smoke.  Get 7-9 hours of sleep, or as recommended by your health care provider.  Limit stress.  Keep lights dim if bright lights bother you and make your migraines worse. SEEK IMMEDIATE MEDICAL CARE IF:   Your migraine becomes severe.  You have a fever.  You have a stiff neck.  You have vision loss.  You have muscular weakness or loss of muscle control.  You start losing your balance or have trouble walking.  You feel faint or pass out.  You have severe symptoms that are different from your first symptoms. MAKE SURE YOU:   Understand these instructions.  Will watch your condition.  Will get help right away if you are not doing well or get worse.   This information is not intended to replace advice given to you by your health care provider. Make sure you discuss any questions you have with your health care provider.   Document Released: 06/21/2005 Document Revised: 07/12/2014 Document Reviewed: 02/26/2013 Elsevier  Interactive Patient Education 2016 Elsevier Inc.  

## 2015-09-23 ENCOUNTER — Ambulatory Visit: Payer: BLUE CROSS/BLUE SHIELD | Admitting: Nurse Practitioner

## 2015-10-10 ENCOUNTER — Encounter: Payer: Self-pay | Admitting: Family Medicine

## 2015-10-10 ENCOUNTER — Ambulatory Visit (INDEPENDENT_AMBULATORY_CARE_PROVIDER_SITE_OTHER): Payer: BLUE CROSS/BLUE SHIELD | Admitting: Family Medicine

## 2015-10-10 VITALS — BP 119/80 | HR 98 | Temp 97.9°F | Ht 70.0 in | Wt 216.0 lb

## 2015-10-10 DIAGNOSIS — J101 Influenza due to other identified influenza virus with other respiratory manifestations: Secondary | ICD-10-CM | POA: Diagnosis not present

## 2015-10-10 DIAGNOSIS — J029 Acute pharyngitis, unspecified: Secondary | ICD-10-CM

## 2015-10-10 DIAGNOSIS — R52 Pain, unspecified: Secondary | ICD-10-CM

## 2015-10-10 LAB — VERITOR FLU A/B WAIVED
Influenza A: NEGATIVE
Influenza B: NEGATIVE

## 2015-10-10 LAB — CULTURE, GROUP A STREP

## 2015-10-10 LAB — RAPID STREP SCREEN (MED CTR MEBANE ONLY): Strep Gp A Ag, IA W/Reflex: NEGATIVE

## 2015-10-10 MED ORDER — OSELTAMIVIR PHOSPHATE 75 MG PO CAPS: 75.0000 mg | ORAL_CAPSULE | Freq: Two times a day (BID) | ORAL | Status: DC

## 2015-10-10 MED ORDER — RIMANTADINE HCL 100 MG PO TABS: 100.0000 mg | ORAL_TABLET | Freq: Two times a day (BID) | ORAL | Status: DC

## 2015-10-10 NOTE — Addendum Note (Signed)
Addended by: Marin Olp on: 10/10/2015 01:57 PM   Modules accepted: Miquel Dunn

## 2015-10-10 NOTE — Progress Notes (Addendum)
Subjective:  Patient ID: Heidi Gibbs, female    DOB: 02-Jun-1991  Age: 25 y.o. MRN: [id number]  CC: Sore Throat and Generalized Body Aches   HPI TELETHIA STROZIER presents for  Patient presents with dry cough runny stuffy nose.Patient also has chills and subjective fever. Body aches worst in the back but present in the legs, shoulders, and torso as well. Has sapped the energy . Onset 2 days ago.    History Arsie has a past medical history of Migraines.   She has past surgical history that includes Wisdom tooth extraction.   Her family history includes Diabetes in her mother; Hypertension in her mother.She reports that she has never smoked. She has never used smokeless tobacco. She reports that she does not drink alcohol or use illicit drugs.    ROS Review of Systems  Constitutional: Positive for appetite change and fatigue. Negative for fever and chills.  HENT: Positive for congestion, ear pain and sore throat. Negative for nosebleeds, postnasal drip, rhinorrhea and sinus pressure.   Respiratory: Positive for cough. Negative for chest tightness and shortness of breath.   Cardiovascular: Negative for chest pain.  Musculoskeletal: Positive for myalgias.  Skin: Negative for rash.  Neurological: Negative for headaches.    Objective:  BP 119/80 mmHg  Pulse 98  Temp(Src) 97.9 F (36.6 C) (Oral)  Ht 5\' 10"  (1.778 m)  Wt 216 lb (97.977 kg)  BMI 30.99 kg/m2  SpO2 99%  BP Readings from Last 3 Encounters:  10/10/15 119/80  07/29/15 124/73  04/02/15 141/94    Wt Readings from Last 3 Encounters:  10/10/15 216 lb (97.977 kg)  07/29/15 212 lb 12.8 oz (96.525 kg)  04/02/15 200 lb (90.719 kg)     Physical Exam  Constitutional: She appears well-developed and well-nourished.  HENT:  Head: Normocephalic and atraumatic.  Right Ear: Tympanic membrane and external ear normal. No decreased hearing is noted.  Left Ear: Tympanic membrane and external ear normal. No  decreased hearing is noted.  Nose: Mucosal edema present. Right sinus exhibits no frontal sinus tenderness. Left sinus exhibits no frontal sinus tenderness.  Mouth/Throat: No oropharyngeal exudate or posterior oropharyngeal erythema.  Neck: No Brudzinski's sign noted.  Pulmonary/Chest: Breath sounds normal. No respiratory distress.  Lymphadenopathy:       Head (right side): No preauricular adenopathy present.       Head (left side): No preauricular adenopathy present.       Right cervical: No superficial cervical adenopathy present.      Left cervical: No superficial cervical adenopathy present.     Gibbs Results  Component Value Date   WBC 10.3 04/02/2015   HGB 14.4 12/29/2012   HCT 42.6 04/02/2015   PLT 368 04/02/2015   GLUCOSE 56* 04/02/2015   ALT 11 04/02/2015   AST 14 04/02/2015   NA 138 04/02/2015   K 4.6 04/02/2015   CL 99 04/02/2015   CREATININE 0.62 04/02/2015   BUN 10 04/02/2015   CO2 25 04/02/2015   TSH 1.000 04/02/2015    Mr Lumbar Spine Wo Contrast  11/14/2013  CLINICAL DATA:  Back pain and bilateral lower leg pain, left greater than right. EXAM: MRI LUMBAR SPINE WITHOUT CONTRAST TECHNIQUE: Multiplanar, multisequence MR imaging of the lumbar spine was performed. No intravenous contrast was administered. COMPARISON:  None. FINDINGS: The patient was unable to remain motionless for the exam. Small or subtle lesions could be overlooked. Anatomic alignment. Mild disc space narrowing L5-S1. Normal conus. No worrisome osseous lesions.  Extra-spinal soft tissues unremarkable. The individual disc spaces were examined with axial images as follows: L1-L2:  Normal. L2-L3:  Normal. L3-L4:  Normal. L4-L5:  Normal. L5-S1: Central disc extrusion projects between the S1 roots. No significant S1 nerve root displacement. No canal stenosis. Mild bilateral foraminal narrowing does not appear significant. IMPRESSION: Central disc extrusion L5-S1.  See discussion above. Electronically Signed   By:  Rolla Flatten M.D.   On: 11/14/2013 09:31    Assessment & Plan:   Jaquanda was seen today for sore throat and generalized body aches.  Diagnoses and all orders for this visit:  Sore throat -     Veritor Flu A/B Waived -     Rapid strep screen (not at [facility])  Body aches -     Veritor Flu A/B Waived -     Rapid strep screen (not at [facility])  Influenza A  Other orders -     Discontinue: oseltamivir (TAMIFLU) 75 MG capsule; Take 1 capsule (75 mg total) by mouth 2 (two) times daily. -     rimantadine (FLUMADINE) 100 MG tablet; Take 1 tablet (100 mg total) by mouth 2 (two) times daily.      I have discontinued Ms. Bruni's Norgestimate-Ethinyl Estradiol Triphasic and oseltamivir. I am also having her start on rimantadine. Additionally, I am having her maintain her traZODone, sertraline, and JOLESSA.  Meds ordered this encounter  Medications  . JOLESSA 0.15-0.03 MG tablet    Sig: Take 1 tablet by mouth daily.  Marland Kitchen DISCONTD: oseltamivir (TAMIFLU) 75 MG capsule    Sig: Take 1 capsule (75 mg total) by mouth 2 (two) times daily.    Dispense:  10 capsule    Refill:  0  . rimantadine (FLUMADINE) 100 MG tablet    Sig: Take 1 tablet (100 mg total) by mouth 2 (two) times daily.    Dispense:  10 tablet    Refill:  0    This replaces the tamiflu sent in a few minutes ago. Pt. States it makes her vomit.     Follow-up: Return if symptoms worsen or fail to improve.  Claretta Fraise, M.D.

## 2015-10-10 NOTE — Addendum Note (Signed)
Addended by: Claretta Fraise on: 10/10/2015 01:52 PM   Modules accepted: Orders, Medications, SmartSet

## 2015-10-21 ENCOUNTER — Ambulatory Visit (INDEPENDENT_AMBULATORY_CARE_PROVIDER_SITE_OTHER): Payer: BLUE CROSS/BLUE SHIELD

## 2015-10-21 ENCOUNTER — Encounter: Payer: Self-pay | Admitting: Family Medicine

## 2015-10-21 ENCOUNTER — Ambulatory Visit (INDEPENDENT_AMBULATORY_CARE_PROVIDER_SITE_OTHER): Payer: BLUE CROSS/BLUE SHIELD | Admitting: Family Medicine

## 2015-10-21 VITALS — BP 139/81 | HR 97 | Temp 96.8°F | Ht 70.0 in | Wt 216.0 lb

## 2015-10-21 DIAGNOSIS — R52 Pain, unspecified: Secondary | ICD-10-CM

## 2015-10-21 DIAGNOSIS — J029 Acute pharyngitis, unspecified: Secondary | ICD-10-CM | POA: Diagnosis not present

## 2015-10-21 DIAGNOSIS — R05 Cough: Secondary | ICD-10-CM

## 2015-10-21 DIAGNOSIS — R591 Generalized enlarged lymph nodes: Secondary | ICD-10-CM

## 2015-10-21 DIAGNOSIS — R059 Cough, unspecified: Secondary | ICD-10-CM

## 2015-10-21 DIAGNOSIS — R51 Headache: Secondary | ICD-10-CM

## 2015-10-21 DIAGNOSIS — R519 Headache, unspecified: Secondary | ICD-10-CM

## 2015-10-21 LAB — URINALYSIS, COMPLETE
Bilirubin, UA: NEGATIVE
Glucose, UA: NEGATIVE
Ketones, UA: NEGATIVE
LEUKOCYTES UA: NEGATIVE
NITRITE UA: NEGATIVE
PH UA: 6.5 (ref 5.0–7.5)
Protein, UA: NEGATIVE
RBC, UA: NEGATIVE
Specific Gravity, UA: 1.02 (ref 1.005–1.030)
UUROB: 0.2 mg/dL (ref 0.2–1.0)

## 2015-10-21 LAB — MICROSCOPIC EXAMINATION

## 2015-10-21 LAB — VERITOR FLU A/B WAIVED
INFLUENZA B: NEGATIVE
Influenza A: NEGATIVE

## 2015-10-21 LAB — PREGNANCY, URINE: PREG TEST UR: NEGATIVE

## 2015-10-21 NOTE — Addendum Note (Signed)
Addended by: Karle Plumber on: 10/21/2015 09:47 AM   Modules accepted: Orders

## 2015-10-21 NOTE — Patient Instructions (Signed)
Great to see you!  I have done several tests looking for waht may give [place] clues to what is going on.   For now rest as much as you can and get plenty of fluids.   Lets follow up in 2 weeks, cancel the appointment if you are better.

## 2015-10-21 NOTE — Progress Notes (Signed)
   HPI  Patient presents today here with prolonged illness.  Patient was diagnosed on March 28 with influenza A at urgent care. She was then seen again in our clinic on April 7 with persistent symptoms, a flu swab was negative at that time.  She is unable to finish any antivirals due to nausea.  She states that she continues to have body aches, headaches, fatigue, and nausea. No vomiting. She has persistent cough, intermittent shortness of breath. She denies any abdominal pain, diarrhea  She is tolerating food and fluids normally.  She has started a new seasonal birth control a few months ago, she's also started trazodone for sleep.  He was also treated with clindamycin for a strep pharyngitis early in the course  PMH: Smoking status noted ROS: Per HPI  Objective: BP 139/81 mmHg  Pulse 97  Temp(Src) 96.8 F (36 C) (Oral)  Ht _0  (1.778 m)  Wt 216 lb (97.977 kg)  BMI 30.99 kg/m2 Gen: NAD, alert, cooperative with exam HEENT: NCAT, TMs normal bilaterally, oropharynx clear and tonsils not enlarged and mildly erythematous with no exudates Neck: Scattered cervical lymph nodes that are tender to palpation CV: RRR, good S1/S2, no murmur Resp: CTABL, no wheezes, non-labored Abd: SNTND, BS present, no guarding or organomegaly Ext: No edema, warm Neuro: Alert and oriented, No gross deficits  Assessment and plan:  # Prolonged illness, influenza, body aches Unclear etiology, early in the course she was positive for flu, however this is an almost 4 weeks. No signs of pneumonia, chest x-ray to confirm Blood work, including evaluation for mono. UA and you prepped Return to clinic in 2 weeks if symptoms have not been explained or are not better.    Orders Placed This Encounter  Procedures  . Veritor Flu A/B Waived    Order Specific Question:  Source    Answer:  nose  . Rapid strep screen (not at [facility])  . Culture, Group A Strep    Order Specific Question:  Source   Answer:  pharynx  . Rapid strep screen (not at [facility])  . Culture, Group A Strep    Order Specific Question:  Source    Answer:  throat  . DG Chest 2 View    Standing Status: Future     Number of Occurrences: 1     Standing Expiration Date: 10/20/2016    Order Specific Question:  Reason for Exam (SYMPTOM  OR DIAGNOSIS REQUIRED)    Answer:  Cough, prolonged illness, eval for CAP    Order Specific Question:  Is the patient pregnant?    Answer:  No    Order Specific Question:  Preferred imaging location?    Answer:  External  . CBC with Differential  . CMP14+EGFR  . Epstein-Barr virus VCA antibody panel  . Urinalysis, Complete  . Pregnancy, urine     Laroy Apple, MD [facility] 10/21/2015, 9:31 AM

## 2015-10-22 LAB — CBC WITH DIFFERENTIAL/PLATELET
BASOS ABS: 0 10*3/uL (ref 0.0–0.2)
Basos: 1 %
EOS (ABSOLUTE): 0.2 10*3/uL (ref 0.0–0.4)
Eos: 2 %
Hematocrit: 40.8 % (ref 34.0–46.6)
Hemoglobin: 13.5 g/dL (ref 11.1–15.9)
Immature Grans (Abs): 0 10*3/uL (ref 0.0–0.1)
Immature Granulocytes: 1 %
LYMPHS ABS: 2.4 10*3/uL (ref 0.7–3.1)
Lymphs: 27 %
MCH: 28 pg (ref 26.6–33.0)
MCHC: 33.1 g/dL (ref 31.5–35.7)
MCV: 85 fL (ref 79–97)
MONOS ABS: 0.4 10*3/uL (ref 0.1–0.9)
Monocytes: 5 %
Neutrophils Absolute: 5.7 10*3/uL (ref 1.4–7.0)
Neutrophils: 64 %
Platelets: 362 10*3/uL (ref 150–379)
RBC: 4.82 x10E6/uL (ref 3.77–5.28)
RDW: 13.6 % (ref 12.3–15.4)
WBC: 8.8 10*3/uL (ref 3.4–10.8)

## 2015-10-22 LAB — CMP14+EGFR
ALK PHOS: 65 IU/L (ref 39–117)
ALT: 7 IU/L (ref 0–32)
AST: 11 IU/L (ref 0–40)
Albumin/Globulin Ratio: 1.3 (ref 1.2–2.2)
Albumin: 3.9 g/dL (ref 3.5–5.5)
BUN/Creatinine Ratio: 12 (ref 9–23)
BUN: 8 mg/dL (ref 6–20)
CHLORIDE: 101 mmol/L (ref 96–106)
CO2: 22 mmol/L (ref 18–29)
CREATININE: 0.69 mg/dL (ref 0.57–1.00)
Calcium: 9.3 mg/dL (ref 8.7–10.2)
GFR calc Af Amer: 143 mL/min/{1.73_m2} (ref >59)
GFR calc non Af Amer: 124 mL/min/{1.73_m2} (ref >59)
GLUCOSE: 86 mg/dL (ref 65–99)
Globulin, Total: 2.9 g/dL (ref 1.5–4.5)
Potassium: 4.5 mmol/L (ref 3.5–5.2)
SODIUM: 141 mmol/L (ref 134–144)
Total Protein: 6.8 g/dL (ref 6.0–8.5)

## 2015-10-22 LAB — EPSTEIN-BARR VIRUS VCA ANTIBODY PANEL: EBV VCA IgG: 91.2 U/mL — ABNORMAL HIGH (ref 0.0–17.9)

## 2015-10-23 LAB — CULTURE, GROUP A STREP: STREP A CULTURE: NEGATIVE

## 2015-10-24 ENCOUNTER — Telehealth: Payer: Self-pay | Admitting: Family Medicine

## 2015-10-24 NOTE — Telephone Encounter (Signed)
Patients mother aware  

## 2015-10-29 ENCOUNTER — Other Ambulatory Visit: Payer: Self-pay | Admitting: Gastroenterology

## 2015-10-29 DIAGNOSIS — R1013 Epigastric pain: Secondary | ICD-10-CM | POA: Diagnosis not present

## 2015-10-29 DIAGNOSIS — R112 Nausea with vomiting, unspecified: Secondary | ICD-10-CM | POA: Diagnosis not present

## 2015-10-29 DIAGNOSIS — F419 Anxiety disorder, unspecified: Secondary | ICD-10-CM | POA: Diagnosis not present

## 2015-10-29 DIAGNOSIS — K589 Irritable bowel syndrome without diarrhea: Secondary | ICD-10-CM | POA: Diagnosis not present

## 2015-10-29 LAB — CULTURE, GROUP A STREP

## 2015-10-29 LAB — RAPID STREP SCREEN (MED CTR MEBANE ONLY): STREP GP A AG, IA W/REFLEX: NEGATIVE

## 2015-11-04 ENCOUNTER — Ambulatory Visit: Payer: BLUE CROSS/BLUE SHIELD | Admitting: Family Medicine

## 2015-11-05 ENCOUNTER — Ambulatory Visit
Admission: RE | Admit: 2015-11-05 | Discharge: 2015-11-05 | Disposition: A | Discharge status: Home / Self Care | Payer: BLUE CROSS/BLUE SHIELD | Source: Ambulatory Visit | Attending: Gastroenterology | Admitting: Gastroenterology

## 2015-11-05 DIAGNOSIS — R112 Nausea with vomiting, unspecified: Secondary | ICD-10-CM

## 2015-11-11 ENCOUNTER — Encounter: Payer: Self-pay | Admitting: Family Medicine

## 2015-11-11 ENCOUNTER — Ambulatory Visit (INDEPENDENT_AMBULATORY_CARE_PROVIDER_SITE_OTHER): Payer: BLUE CROSS/BLUE SHIELD | Admitting: Family Medicine

## 2015-11-11 VITALS — BP 146/87 | HR 101 | Temp 97.0°F | Ht 70.0 in | Wt 218.2 lb

## 2015-11-11 DIAGNOSIS — N538 Other male sexual dysfunction: Secondary | ICD-10-CM | POA: Diagnosis not present

## 2015-11-11 DIAGNOSIS — N941 Unspecified dyspareunia: Secondary | ICD-10-CM | POA: Diagnosis not present

## 2015-11-11 DIAGNOSIS — F411 Generalized anxiety disorder: Secondary | ICD-10-CM

## 2015-11-11 MED ORDER — SERTRALINE HCL 100 MG PO TABS: ORAL_TABLET | ORAL | Status: DC

## 2015-11-11 NOTE — Patient Instructions (Signed)
Great to see you!  You can increase your dose to 100 mg right away by talking two 50 mg capsules.   Lets follow up in 4-6 weeks

## 2015-11-11 NOTE — Progress Notes (Signed)
   HPI  Patient presents today or increased anxiety.  Patient spends her last 2 weeks she's had more difficulty with her mood. She describes generalized anxiety and feeling like something that is can happen. She describes 3 different episodes of panic attacks, lasting 15 minutes or less on each event.  She requests going on Zoloft.  She denies depression and suicidal ideation.  Cytology is regularly, he recommended that she increase the dose of Zoloft about 2 months ago, however her family was not supportive of this.  He has no easily identifiable light stressors currently.  PMH: Smoking status noted ROS: Per HPI  Objective: BP 146/87 mmHg  Pulse 101  Temp(Src) 97 F (36.1 C) (Oral)  Ht 5\' 10"  (1.778 m)  Wt 218 lb 3.2 oz (98.975 kg)  BMI 31.31 kg/m2 Gen: NAD, alert, cooperative with exam HEENT: NCAT, nares with some swelling of turbinates bilaterally CV: RRR, good S1/S2, no murmur Resp: CTABL, no wheezes, non-labored Ext: No edema, warm Neuro: Alert and oriented, No gross deficits  Assessment and plan:  # Generalized anxiety disorder Titrating Zoloft 200 mg Follow-up in 4-6 weeks Encouraged her to continue psychology/therapy. Return to clinic with any concerns otherwise follow-up as above  Allergic rhinitis Discussed over-the-counter allergy medications  Meds ordered this encounter  Medications  . omeprazole (PRILOSEC) 20 MG capsule    Sig: Take 20 mg by mouth daily.    Refill:  12  . sertraline (ZOLOFT) 100 MG tablet    Sig: Take one po qd    Dispense:  90 tablet    Refill:  0    Laroy Apple, MD Tr[facility] Schro[facility] 11/11/2015, 8:04 AM

## 2015-12-09 ENCOUNTER — Encounter: Payer: Self-pay | Admitting: Family Medicine

## 2015-12-09 ENCOUNTER — Ambulatory Visit (INDEPENDENT_AMBULATORY_CARE_PROVIDER_SITE_OTHER): Payer: BLUE CROSS/BLUE SHIELD | Admitting: Family Medicine

## 2015-12-09 VITALS — BP 136/86 | HR 91 | Temp 97.7°F | Ht 70.0 in | Wt 220.6 lb

## 2015-12-09 DIAGNOSIS — F411 Generalized anxiety disorder: Secondary | ICD-10-CM

## 2015-12-09 DIAGNOSIS — M5136 Other intervertebral disc degeneration, lumbar region: Secondary | ICD-10-CM

## 2015-12-09 DIAGNOSIS — M51369 Other intervertebral disc degeneration, lumbar region without mention of lumbar back pain or lower extremity pain: Secondary | ICD-10-CM | POA: Insufficient documentation

## 2015-12-09 DIAGNOSIS — M5126 Other intervertebral disc displacement, lumbar region: Secondary | ICD-10-CM | POA: Insufficient documentation

## 2015-12-09 MED ORDER — SERTRALINE HCL 100 MG PO TABS: ORAL_TABLET | ORAL | Status: DC

## 2015-12-09 MED ORDER — PREDNISONE 50 MG PO TABS: 50.0000 mg | ORAL_TABLET | Freq: Every day | ORAL | Status: DC

## 2015-12-09 NOTE — Progress Notes (Signed)
   HPI  Patient presents today to follow-up for anxiety.  Anxiety is much better after starting Zoloft at the higher dose She's had a little bit more air irritability but overall is very happy with the medication. He denies any upset stomach or other complaints about the medications.  Degenerative disc disease New problem to me States that for the last 2 years she's had a degenerative disc, she's been seeing a neurosurgeon and getting epidural injections for pain. Over the last 2 weeks she's had severe worsening pain. She works in [employer] and has 2 with chairs, heavy bags at work, and standing for prolonged periods of time She's had increased pain with long ten-hour shifts recently, she would like some work restrictions.  She's seeing her neurosurgeon in 2 weeks  PMH: Smoking status noted ROS: Per HPI  Objective: BP 136/86 mmHg  Pulse 91  Temp(Src) 97.7 F (36.5 C) (Oral)  Ht 5\' 10"  (1.778 m)  Wt 220 lb 9.6 oz (100.064 kg)  BMI 31.65 kg/m2 Gen: NAD, alert, cooperative with exam HEENT: NCAT CV: RRR, good S1/S2, no murmur Resp: CTABL, no wheezes, non-labored Ext: No edema, warm Neuro: Alert and oriented, No gross deficits MSK Mild tenderness to palpation of upper lumbar area is on the bony prominences as well as the paraspinal muscles  Assessment and plan:  # Anxiety Doing better with Zoloft, refilled Follow-up 3 months Consider Cymbalta if chronic musculoskeletal pain becomes an issue  # Degenerative disc disease, lumbar back pain New problem to me Current flare Prednisone burst Written similar for work limiting her to lifting no more than 10 pounds and limiting repetitive bending motions     Meds ordered this encounter  Medications  . predniSONE (DELTASONE) 50 MG tablet    Sig: Take 1 tablet (50 mg total) by mouth daily with breakfast.    Dispense:  5 tablet    Refill:  0  . sertraline (ZOLOFT) 100 MG tablet    Sig: Take one po qd    Dispense:  90  tablet    Refill:  Emmett, MD [facility] 12/09/2015, 8:12 AM

## 2015-12-09 NOTE — Patient Instructions (Signed)
Great to see you!  I have sent a prednisone burst, 1 pill daily for 5 days, start early in the day.   I am glad you are feeling better!  Lets see you again in 3 months

## 2015-12-10 DIAGNOSIS — N9419 Other specified dyspareunia: Secondary | ICD-10-CM | POA: Diagnosis not present

## 2015-12-10 DIAGNOSIS — H60501 Unspecified acute noninfective otitis externa, right ear: Secondary | ICD-10-CM | POA: Diagnosis not present

## 2015-12-10 DIAGNOSIS — H6123 Impacted cerumen, bilateral: Secondary | ICD-10-CM | POA: Diagnosis not present

## 2015-12-23 DIAGNOSIS — R030 Elevated blood-pressure reading, without diagnosis of hypertension: Secondary | ICD-10-CM | POA: Diagnosis not present

## 2015-12-23 DIAGNOSIS — Z6832 Body mass index (BMI) 32.0-32.9, adult: Secondary | ICD-10-CM | POA: Diagnosis not present

## 2015-12-23 DIAGNOSIS — M5137 Other intervertebral disc degeneration, lumbosacral region: Secondary | ICD-10-CM | POA: Diagnosis not present

## 2015-12-29 ENCOUNTER — Other Ambulatory Visit (HOSPITAL_COMMUNITY): Payer: Self-pay | Admitting: Neurosurgery

## 2015-12-29 DIAGNOSIS — M5137 Other intervertebral disc degeneration, lumbosacral region: Secondary | ICD-10-CM

## 2015-12-30 ENCOUNTER — Other Ambulatory Visit: Payer: Self-pay | Admitting: Family Medicine

## 2016-01-07 ENCOUNTER — Ambulatory Visit (HOSPITAL_COMMUNITY)
Admission: RE | Admit: 2016-01-07 | Discharge: 2016-01-07 | Disposition: A | Discharge status: Home / Self Care | Payer: BLUE CROSS/BLUE SHIELD | Source: Ambulatory Visit | Attending: Neurosurgery | Admitting: Neurosurgery

## 2016-01-07 DIAGNOSIS — M5127 Other intervertebral disc displacement, lumbosacral region: Secondary | ICD-10-CM | POA: Diagnosis not present

## 2016-01-07 DIAGNOSIS — M5126 Other intervertebral disc displacement, lumbar region: Secondary | ICD-10-CM | POA: Diagnosis not present

## 2016-01-07 DIAGNOSIS — M5137 Other intervertebral disc degeneration, lumbosacral region: Secondary | ICD-10-CM | POA: Insufficient documentation

## 2016-01-10 DIAGNOSIS — L709 Acne, unspecified: Secondary | ICD-10-CM | POA: Diagnosis not present

## 2016-01-14 DIAGNOSIS — R102 Pelvic and perineal pain: Secondary | ICD-10-CM | POA: Diagnosis not present

## 2016-01-14 DIAGNOSIS — R350 Frequency of micturition: Secondary | ICD-10-CM | POA: Diagnosis not present

## 2016-01-15 DIAGNOSIS — M5137 Other intervertebral disc degeneration, lumbosacral region: Secondary | ICD-10-CM | POA: Diagnosis not present

## 2016-01-15 DIAGNOSIS — Z6832 Body mass index (BMI) 32.0-32.9, adult: Secondary | ICD-10-CM | POA: Diagnosis not present

## 2016-01-15 DIAGNOSIS — M5126 Other intervertebral disc displacement, lumbar region: Secondary | ICD-10-CM | POA: Diagnosis not present

## 2016-01-16 ENCOUNTER — Telehealth: Payer: Self-pay | Admitting: Family Medicine

## 2016-01-16 NOTE — Telephone Encounter (Signed)
Pt's mother is aware that you are out of the office until Monday. Pt had an MRI through neuro which showed a pinched nerve and herniated disc. Pt's mother has also called neuro about trying to get a note but hasn't heard anything back. Please advise.

## 2016-01-20 NOTE — Telephone Encounter (Signed)
Ok toi extend note through 2 more weeks of limited duty, needs to see neuro for more eval and long term definitive care.   Laroy Apple, MD [facility] 01/20/2016, 7:40 AM

## 2016-01-20 NOTE — Telephone Encounter (Signed)
Mother aware that letter is ready for pick up.

## 2016-01-27 ENCOUNTER — Ambulatory Visit: Payer: BLUE CROSS/BLUE SHIELD | Admitting: Family Medicine

## 2016-03-15 ENCOUNTER — Encounter: Payer: Self-pay | Admitting: Family Medicine

## 2016-03-15 ENCOUNTER — Ambulatory Visit (INDEPENDENT_AMBULATORY_CARE_PROVIDER_SITE_OTHER): Payer: BLUE CROSS/BLUE SHIELD | Admitting: Family Medicine

## 2016-03-15 VITALS — BP 127/72 | HR 83 | Temp 97.1°F | Ht 70.0 in | Wt 237.4 lb

## 2016-03-15 DIAGNOSIS — H6123 Impacted cerumen, bilateral: Secondary | ICD-10-CM

## 2016-03-15 DIAGNOSIS — F411 Generalized anxiety disorder: Secondary | ICD-10-CM

## 2016-03-15 DIAGNOSIS — L659 Nonscarring hair loss, unspecified: Secondary | ICD-10-CM

## 2016-03-15 MED ORDER — DULOXETINE HCL 30 MG PO CPEP: ORAL_CAPSULE | ORAL | 0 refills | Status: DC

## 2016-03-15 NOTE — Progress Notes (Signed)
   HPI  Patient presents today in follow-up for anxiety, also complaining of hair loss and ear pain.  Anxiety Doing very well on Zoloft, denies any suicidal thoughts or depression.  Has been having dyspareunia, working with her GYN for that.  Hair loss Has been going on for several months, described as clumps of hair coming out in the shower, right-sided temporal area seems to be sending the most, no areas of balding.  Ear pain has been going on for a few weeks, less muffled hearing. Positive history of cerumen impaction.  Fevers, chills, sweats  PMH: Smoking status noted ROS: Per HPI  Objective: BP 127/72   Pulse 83   Temp 97.1 F (36.2 C) (Oral)   Ht 5\' 10"  (1.778 m)   Wt 237 lb 6.4 oz (107.7 kg)   BMI 34.06 kg/m  Gen: NAD, alert, cooperative with exam HEENT: NCAT, cerumen impaction bilaterally CV: RRR, good S1/S2, no murmur Resp: CTABL, no wheezes, non-labored Ext: No edema, warm Neuro: Alert and oriented, No gross deficits  Depression screen [facility] 2/9 03/15/2016 12/09/2015 11/11/2015 07/29/2015 04/02/2015  Decreased Interest 0 0 0 - 0  Down, Depressed, Hopeless 0 0 0 - 0  PHQ - 2 Score 0 0 0 - 0  Altered sleeping - - - 2 -  Tired, decreased energy - - - 2 -  Change in appetite - - - 0 -  Feeling bad or failure about yourself  - - - 0 -  Trouble concentrating - - - 0 -  Moving slowly or fidgety/restless - - - 0 -  Suicidal thoughts - - - 0 -   GAD 7 : Generalized Anxiety Score 03/15/2016 11/11/2015  Nervous, Anxious, on Edge 0 3  Control/stop worrying 0 3  Worry too much - different things 0 3  Trouble relaxing 0 3  Restless 0 3  Easily annoyed or irritable 0 3  Afraid - awful might happen 0 3  Total GAD 7 Score 0 21  Anxiety Difficulty Not difficult at all Very difficult      Assessment and plan:  # Generalized anxiety disorder Well-controlled on Zoloft She is concerned about dyspareunia and hair loss being related to Zoloft so we will change to Cymbalta  today. Cymbalta may help with chronic most skeletal pain as well   # Hair loss Very mild thinning on the right temporal area, Recommended multivitamin Labs unLikely to be related to SSRI, however changing today per her request  # Cerumen impaction Washed out in clinic today with ear irrigation bilaterally L Ear canal cleaned out well, Barton, MD [facility] 03/15/2016, 8:11 AM

## 2016-03-15 NOTE — Patient Instructions (Signed)
Great to see you!  Take one half Zoloft tablet daily plus one capsule of Cymbalta for 1 week, then stop Zoloft and start 2 capsules of Cymbalta daily.  Come back in 3-4 weeks for follow-up.  We will call about your labs or send them on my chart

## 2016-03-16 ENCOUNTER — Telehealth: Payer: Self-pay | Admitting: *Deleted

## 2016-03-16 LAB — CBC WITH DIFFERENTIAL/PLATELET
BASOS: 0 %
Basophils Absolute: 0 10*3/uL (ref 0.0–0.2)
EOS (ABSOLUTE): 0.3 10*3/uL (ref 0.0–0.4)
EOS: 4 %
HEMATOCRIT: 38.5 % (ref 34.0–46.6)
HEMOGLOBIN: 12.4 g/dL (ref 11.1–15.9)
IMMATURE GRANS (ABS): 0 10*3/uL (ref 0.0–0.1)
IMMATURE GRANULOCYTES: 0 %
LYMPHS: 33 %
Lymphocytes Absolute: 2.8 10*3/uL (ref 0.7–3.1)
MCH: 26.4 pg — ABNORMAL LOW (ref 26.6–33.0)
MCHC: 32.2 g/dL (ref 31.5–35.7)
MCV: 82 fL (ref 79–97)
MONOCYTES: 6 %
Monocytes Absolute: 0.5 10*3/uL (ref 0.1–0.9)
NEUTROS PCT: 57 %
Neutrophils Absolute: 4.8 10*3/uL (ref 1.4–7.0)
PLATELETS: 357 10*3/uL (ref 150–379)
RBC: 4.69 x10E6/uL (ref 3.77–5.28)
RDW: 14.6 % (ref 12.3–15.4)
WBC: 8.5 10*3/uL (ref 3.4–10.8)

## 2016-03-16 LAB — T4, FREE: Free T4: 0.87 ng/dL (ref 0.82–1.77)

## 2016-03-16 LAB — CMP14+EGFR
A/G RATIO: 1.4 (ref 1.2–2.2)
ALT: 10 IU/L (ref 0–32)
AST: 10 IU/L (ref 0–40)
Albumin: 3.8 g/dL (ref 3.5–5.5)
Alkaline Phosphatase: 63 IU/L (ref 39–117)
BUN/Creatinine Ratio: 15 (ref 9–23)
BUN: 10 mg/dL (ref 6–20)
Bilirubin Total: 0.2 mg/dL (ref 0.0–1.2)
CALCIUM: 9.4 mg/dL (ref 8.7–10.2)
CO2: 22 mmol/L (ref 18–29)
Chloride: 102 mmol/L (ref 96–106)
Creatinine, Ser: 0.68 mg/dL (ref 0.57–1.00)
GFR, EST AFRICAN AMERICAN: 144 mL/min/{1.73_m2} (ref >59)
GFR, EST NON AFRICAN AMERICAN: 125 mL/min/{1.73_m2} (ref >59)
GLOBULIN, TOTAL: 2.7 g/dL (ref 1.5–4.5)
Glucose: 84 mg/dL (ref 65–99)
POTASSIUM: 4.7 mmol/L (ref 3.5–5.2)
Sodium: 140 mmol/L (ref 134–144)
TOTAL PROTEIN: 6.5 g/dL (ref 6.0–8.5)

## 2016-03-16 LAB — THYROID PANEL WITH TSH
Free Thyroxine Index: 1.4 (ref 1.2–4.9)
T3 Uptake Ratio: 20 % — ABNORMAL LOW (ref 24–39)
T4, Total: 7 ug/dL (ref 4.5–12.0)
TSH: 1.81 u[IU]/mL (ref 0.450–4.500)

## 2016-03-16 LAB — T3, FREE: T3, Free: 3.6 pg/mL (ref 2.0–4.4)

## 2016-03-16 NOTE — Telephone Encounter (Signed)
Pt notified of results Verbalizes understanding 

## 2016-03-16 NOTE — Telephone Encounter (Signed)
-----   Message from Timmothy Euler, MD sent at 03/16/2016  7:51 AM EDT ----- Will you call about her labs? Referring looks good including thyroid, blood counts, kidney function, and liver enzymes. Thanks! sam

## 2016-03-25 ENCOUNTER — Ambulatory Visit (INDEPENDENT_AMBULATORY_CARE_PROVIDER_SITE_OTHER): Payer: BLUE CROSS/BLUE SHIELD | Admitting: Family Medicine

## 2016-03-25 ENCOUNTER — Encounter: Payer: Self-pay | Admitting: Family Medicine

## 2016-03-25 VITALS — BP 127/73 | HR 85 | Temp 98.3°F | Ht 70.0 in | Wt 237.6 lb

## 2016-03-25 DIAGNOSIS — R040 Epistaxis: Secondary | ICD-10-CM

## 2016-03-25 DIAGNOSIS — M5412 Radiculopathy, cervical region: Secondary | ICD-10-CM | POA: Diagnosis not present

## 2016-03-25 DIAGNOSIS — G43809 Other migraine, not intractable, without status migrainosus: Secondary | ICD-10-CM | POA: Diagnosis not present

## 2016-03-25 NOTE — Patient Instructions (Signed)
Great to see you!  You will be called to make an appointment with a headache clinic, you can ceratainly discuss this with your neurosurgeon as well.   For the nosebleeds Try nasal saline gel twice daily.

## 2016-03-25 NOTE — Progress Notes (Signed)
   HPI  Patient presents today frequent mild nosebleeds.  Patient explains the last 2 months she's had frequent nosebleeds. She explains that at times she coughs up a small amount of blood that she can tell is coming from her nose, she has small amounts of blood coming from her nose, and on one episode she had bloody crusting in the corner of her eyes.  Headaches Report severe migraine headaches, more than 10 per month. She states they last one hour to 2 days, most are more than 6 hours. Described as retro-orbital throbbing pain that last for hours. She has photophobia and associated nausea. No oral.  She states that a few nights ago she had an episode where she observed different sized pupils  PMH: Smoking status noted ROS: Per HPI  Objective: BP 127/73   Pulse 85   Temp 98.3 F (36.8 C) (Oral)   Ht 5\' 10"  (1.778 m)   Wt 237 lb 9.6 oz (107.8 kg)   BMI 34.09 kg/m  Gen: NAD, alert, cooperative with exam HEENT: NCAT, EOMI, symmetric pupils with appropriate response bilaterally Nares with erythema and swelling of the nasal mucosa CV: RRR, good S1/S2, no murmur Resp: CTABL, no wheezes, non-labored Ext: No edema, warm Neuro: Alert and oriented, strength 5/5 and sensation intact in all 4 extremities, reported paresthesias on the leg which are stable with bulging lumbar disks Cranial nerves II through XII intact Normal gait 1+ symmetric patellar tendon reflexes bilaterally  Assessment and plan:  # Frequent nosebleeds New problem Likely allergic rhinitis and swollen irritated nasal mucosa is the etiology Recommended nasal saline gel twice daily Continue antihistamine  Bloody crusting of the corners of her eyes is likely retrograde flow of bloody mucus from the nose upward through the nasolacrimal duct.  # Migraine headache Typical migraine headaches, although they are very very frequent. Concerning symptom of asymmetric pupils, however objectively they are fine on my  exam. Given the serious implications of having anisocoria, even if it was only transient, I have recommended and referred her to neurologist/headache clinic. Given that I cannot objectively identify currently have not ordered further testing, however I do think that neurology evaluation is necessary.   Orders Placed This Encounter  Procedures  . AMB referral to headache clinic    Referral Priority:   Routine    Referral Type:   Consultation    Number of Visits Requested:   [place], MD [facility] 03/25/2016, 8:39 AM

## 2016-03-29 ENCOUNTER — Telehealth: Payer: Self-pay | Admitting: Family Medicine

## 2016-03-29 NOTE — Telephone Encounter (Signed)
Scheduled

## 2016-03-30 ENCOUNTER — Ambulatory Visit: Payer: BLUE CROSS/BLUE SHIELD

## 2016-04-03 DIAGNOSIS — J069 Acute upper respiratory infection, unspecified: Secondary | ICD-10-CM | POA: Diagnosis not present

## 2016-04-03 DIAGNOSIS — J209 Acute bronchitis, unspecified: Secondary | ICD-10-CM | POA: Diagnosis not present

## 2016-04-03 DIAGNOSIS — H66001 Acute suppurative otitis media without spontaneous rupture of ear drum, right ear: Secondary | ICD-10-CM | POA: Diagnosis not present

## 2016-04-07 DIAGNOSIS — M5412 Radiculopathy, cervical region: Secondary | ICD-10-CM | POA: Diagnosis not present

## 2016-04-13 DIAGNOSIS — Z23 Encounter for immunization: Secondary | ICD-10-CM | POA: Diagnosis not present

## 2016-04-20 DIAGNOSIS — L700 Acne vulgaris: Secondary | ICD-10-CM | POA: Diagnosis not present

## 2016-04-29 DIAGNOSIS — M5126 Other intervertebral disc displacement, lumbar region: Secondary | ICD-10-CM | POA: Diagnosis not present

## 2016-05-04 DIAGNOSIS — Z113 Encounter for screening for infections with a predominantly sexual mode of transmission: Secondary | ICD-10-CM | POA: Diagnosis not present

## 2016-05-04 DIAGNOSIS — Z6834 Body mass index (BMI) 34.0-34.9, adult: Secondary | ICD-10-CM | POA: Diagnosis not present

## 2016-05-04 DIAGNOSIS — Z124 Encounter for screening for malignant neoplasm of cervix: Secondary | ICD-10-CM | POA: Diagnosis not present

## 2016-05-04 DIAGNOSIS — Z01419 Encounter for gynecological examination (general) (routine) without abnormal findings: Secondary | ICD-10-CM | POA: Diagnosis not present

## 2016-05-07 ENCOUNTER — Encounter: Payer: Self-pay | Admitting: Family Medicine

## 2016-05-07 ENCOUNTER — Ambulatory Visit (INDEPENDENT_AMBULATORY_CARE_PROVIDER_SITE_OTHER): Payer: BLUE CROSS/BLUE SHIELD | Admitting: Family Medicine

## 2016-05-07 VITALS — BP 141/77 | HR 92 | Temp 97.5°F | Ht 70.0 in | Wt 242.4 lb

## 2016-05-07 DIAGNOSIS — M25649 Stiffness of unspecified hand, not elsewhere classified: Secondary | ICD-10-CM

## 2016-05-07 DIAGNOSIS — R7982 Elevated C-reactive protein (CRP): Secondary | ICD-10-CM

## 2016-05-07 DIAGNOSIS — R5382 Chronic fatigue, unspecified: Secondary | ICD-10-CM | POA: Diagnosis not present

## 2016-05-07 MED ORDER — DULOXETINE HCL 60 MG PO CPEP: 60.0000 mg | ORAL_CAPSULE | Freq: Every day | ORAL | 5 refills | Status: DC

## 2016-05-07 NOTE — Progress Notes (Signed)
   HPI  Patient presents today here with fatigue.  Patient explains she's had joint stiffness, fatigue, joint pains, body aches over the last 7 months or so. This all began after 2 episodes of flu back to back.  [facility] and was told she may have fibromyalgia.  She has been on Cymbalta for about 2 weeks, she has recently titrated up to 60 mg daily with no side effects. No depression, no improvement yet with the anxiety.  She states that she has about one hour of joint stiffness most severe in her hands daily. She states that one hour is an optimistic amount. Sometimes, almost on a daily basis she has left second digit PIP redness and swelling  PMH: Smoking status noted ROS: Per HPI  Objective: BP (!) 141/77   Pulse 92   Temp 97.5 F (36.4 C) (Oral)   Ht 5\' 10"  (1.778 m)   Wt 242 lb 6.4 oz (110 kg)   BMI 34.78 kg/m  Gen: NAD, alert, cooperative with exam HEENT: NCAT, EOMI, PERRL CV: RRR, good S1/S2, no murmur Resp: CTABL, no wheezes, non-labored Ext: No edema, warm Neuro: Alert and oriented, No gross deficits  Depression screen [facility] 2/9 05/07/2016 03/25/2016 03/15/2016 12/09/2015 11/11/2015  Decreased Interest 0 0 0 0 0  Down, Depressed, Hopeless 0 0 0 0 0  PHQ - 2 Score 0 0 0 0 0  Altered sleeping - - - - -  Tired, decreased energy - - - - -  Change in appetite - - - - -  Feeling bad or failure about yourself  - - - - -  Trouble concentrating - - - - -  Moving slowly or fidgety/restless - - - - -  Suicidal thoughts - - - - -     Assessment and plan:  # Joint stiffness, fatigue Considering age and acute onset 7 months ago I am concerned about rheumatologic disorder Also consider fibromyalgia, if labs are negative then I would consider complete evaluation for fibromyalgia Patient has discectomy next month with Dr. Prince Rome Titrate Cymbalta 60 mg, refilled  Recent labs largely normal  # Generalized anxiety disorder No improvement yet,  stable Tolerating Cymbalta without side effect, continue 60 mg     Orders Placed This Encounter  Procedures  . ANA Comprehensive Panel  . C-reactive protein  . Sedimentation rate    Meds ordered this encounter  Medications  . DULoxetine (CYMBALTA) 60 MG capsule    Sig: Take 1 capsule (60 mg total) by mouth daily.    Dispense:  30 capsule    Refill:  [place], MD [facility] 05/07/2016, 8:42 AM

## 2016-05-07 NOTE — Patient Instructions (Signed)
Great to see you!   We will call with labs within 1 week 

## 2016-05-08 LAB — ANA COMPREHENSIVE PANEL
Anti JO-1: <0.2 AI (ref 0.0–0.9)
Centromere Ab Screen: 0.3 AI (ref 0.0–0.9)
Chromatin Ab SerPl-aCnc: <0.2 AI (ref 0.0–0.9)
ENA RNP Ab: <0.2 AI (ref 0.0–0.9)
ENA SM Ab Ser-aCnc: <0.2 AI (ref 0.0–0.9)
ENA SSA (RO) Ab: <0.2 AI (ref 0.0–0.9)
ENA SSB (LA) Ab: <0.2 AI (ref 0.0–0.9)
Scleroderma SCL-70: <0.2 AI (ref 0.0–0.9)
dsDNA Ab: <1 [IU]/mL (ref 0–9)

## 2016-05-08 LAB — SEDIMENTATION RATE: Sed Rate: 21 mm/h (ref 0–32)

## 2016-05-08 LAB — C-REACTIVE PROTEIN: CRP: 21.5 mg/L — ABNORMAL HIGH (ref 0.0–4.9)

## 2016-05-11 NOTE — Addendum Note (Signed)
Addended by: Thana Ates on: 05/11/2016 04:46 PM   Modules accepted: Orders

## 2016-05-12 DIAGNOSIS — N870 Mild cervical dysplasia: Secondary | ICD-10-CM | POA: Diagnosis not present

## 2016-05-12 DIAGNOSIS — Z6835 Body mass index (BMI) 35.0-35.9, adult: Secondary | ICD-10-CM | POA: Diagnosis not present

## 2016-05-12 DIAGNOSIS — R87810 Cervical high risk human papillomavirus (HPV) DNA test positive: Secondary | ICD-10-CM | POA: Diagnosis not present

## 2016-05-12 DIAGNOSIS — R87610 Atypical squamous cells of undetermined significance on cytologic smear of cervix (ASC-US): Secondary | ICD-10-CM | POA: Diagnosis not present

## 2016-06-07 DIAGNOSIS — F32A Depression, unspecified: Secondary | ICD-10-CM | POA: Insufficient documentation

## 2016-06-07 DIAGNOSIS — K219 Gastro-esophageal reflux disease without esophagitis: Secondary | ICD-10-CM | POA: Insufficient documentation

## 2016-06-07 DIAGNOSIS — F320 Major depressive disorder, single episode, mild: Secondary | ICD-10-CM | POA: Insufficient documentation

## 2016-06-07 DIAGNOSIS — E669 Obesity, unspecified: Secondary | ICD-10-CM | POA: Insufficient documentation

## 2016-06-21 DIAGNOSIS — Z881 Allergy status to other antibiotic agents status: Secondary | ICD-10-CM | POA: Diagnosis not present

## 2016-06-21 DIAGNOSIS — Z79899 Other long term (current) drug therapy: Secondary | ICD-10-CM | POA: Diagnosis not present

## 2016-06-21 DIAGNOSIS — Z888 Allergy status to other drugs, medicaments and biological substances status: Secondary | ICD-10-CM | POA: Diagnosis not present

## 2016-06-21 DIAGNOSIS — Z6834 Body mass index (BMI) 34.0-34.9, adult: Secondary | ICD-10-CM | POA: Diagnosis not present

## 2016-06-21 DIAGNOSIS — K219 Gastro-esophageal reflux disease without esophagitis: Secondary | ICD-10-CM | POA: Diagnosis not present

## 2016-06-21 DIAGNOSIS — F419 Anxiety disorder, unspecified: Secondary | ICD-10-CM | POA: Diagnosis not present

## 2016-06-21 DIAGNOSIS — E669 Obesity, unspecified: Secondary | ICD-10-CM | POA: Diagnosis not present

## 2016-06-21 DIAGNOSIS — M5116 Intervertebral disc disorders with radiculopathy, lumbar region: Secondary | ICD-10-CM | POA: Diagnosis not present

## 2016-06-21 DIAGNOSIS — I10 Essential (primary) hypertension: Secondary | ICD-10-CM | POA: Diagnosis not present

## 2016-06-21 DIAGNOSIS — F329 Major depressive disorder, single episode, unspecified: Secondary | ICD-10-CM | POA: Diagnosis not present

## 2016-06-21 DIAGNOSIS — M5126 Other intervertebral disc displacement, lumbar region: Secondary | ICD-10-CM | POA: Diagnosis not present

## 2016-06-21 DIAGNOSIS — M1990 Unspecified osteoarthritis, unspecified site: Secondary | ICD-10-CM | POA: Diagnosis not present

## 2016-06-21 DIAGNOSIS — Z880 Allergy status to penicillin: Secondary | ICD-10-CM | POA: Diagnosis not present

## 2016-06-21 HISTORY — PX: LUMBAR DISC SURGERY: SHX700

## 2016-07-06 ENCOUNTER — Ambulatory Visit (INDEPENDENT_AMBULATORY_CARE_PROVIDER_SITE_OTHER): Payer: BLUE CROSS/BLUE SHIELD | Admitting: Family Medicine

## 2016-07-06 ENCOUNTER — Encounter: Payer: Self-pay | Admitting: Family Medicine

## 2016-07-06 VITALS — BP 145/85 | HR 119 | Temp 97.0°F | Ht 70.0 in | Wt 243.4 lb

## 2016-07-06 DIAGNOSIS — R002 Palpitations: Secondary | ICD-10-CM

## 2016-07-06 DIAGNOSIS — R000 Tachycardia, unspecified: Secondary | ICD-10-CM | POA: Diagnosis not present

## 2016-07-06 MED ORDER — METOPROLOL SUCCINATE ER 25 MG PO TB24: 25.0000 mg | ORAL_TABLET | Freq: Every day | ORAL | 1 refills | Status: DC

## 2016-07-06 NOTE — Patient Instructions (Signed)
Great to see you!  Start metoprolol 1 pill once daily, you may increase to 2 pills once daily in 1 -2 weeks if you are still having palpitations and you are tolerating the medicine ok.   You will hear from [place] for a cardiology appointment

## 2016-07-06 NOTE — Progress Notes (Signed)
   HPI  Patient presents today here for blood pressure recheck.  Patient states that during her surgery 2 weeks ago she had blood pressure documented as high as 170/100, she's had intermittent episodes of palpitations since that time. She states the palpitations are characterized by feeling like her heart is racing, feeling lightheaded, and sometimes almost passing out.  She has not checked her blood pressure since leaving the hospital.  She does not have any severe headaches, chest pain, or shortness of breath.  PMH: Smoking status noted ROS: Per HPI  Objective: BP (!) 145/85   Pulse (!) 119   Temp 97 F (36.1 C) (Oral)   Ht '5\' 10"'$  (1.778 m)   Wt 243 lb 6.4 oz (110.4 kg)   BMI 34.92 kg/m  Gen: NAD, alert, cooperative with exam HEENT: NCAT CV: RRR, good S1/S2, no murmur Resp: CTABL, no wheezes, non-labored Ext: No edema, warm Neuro: Alert and oriented, No gross deficits  Skin:  Healing scar over lumbar spine   Assessment and plan:  # Palpitations, tachycardia Unclear etiology, patient does not feel anxious although she does have a history of anxiety disorder. Start metoprolol with symptomatic palpitations, also I have referred her to cardiology. Usual how this started after her surgery. Patient has not been taking any additional medications. Labs     Orders Placed This Encounter  Procedures  . CBC with Differential/Platelet  . CMP14+EGFR  . TSH  . Ambulatory referral to Cardiology    Referral Priority:   Routine    Referral Type:   Consultation    Referral Reason:   Specialty Services Required    Requested Specialty:   Cardiology    Number of Visits Requested:   1  . EKG 12-Lead    Meds ordered this encounter  Medications  . metoprolol succinate (TOPROL-XL) 25 MG 24 hr tablet    Sig: Take 1 tablet (25 mg total) by mouth daily.    Dispense:  30 tablet    Refill:  [place], MD [facility] 07/06/2016, 11:26 AM

## 2016-07-07 LAB — CBC WITH DIFFERENTIAL/PLATELET
BASOS ABS: 0.1 10*3/uL (ref 0.0–0.2)
BASOS: 1 %
EOS (ABSOLUTE): 0.2 10*3/uL (ref 0.0–0.4)
Eos: 2 %
Hematocrit: 41 % (ref 34.0–46.6)
Hemoglobin: 13.3 g/dL (ref 11.1–15.9)
IMMATURE GRANS (ABS): 0 10*3/uL (ref 0.0–0.1)
Immature Granulocytes: 0 %
LYMPHS ABS: 2.7 10*3/uL (ref 0.7–3.1)
Lymphs: 28 %
MCH: 26.5 pg — AB (ref 26.6–33.0)
MCHC: 32.4 g/dL (ref 31.5–35.7)
MCV: 82 fL (ref 79–97)
Monocytes Absolute: 0.6 10*3/uL (ref 0.1–0.9)
Monocytes: 7 %
NEUTROS ABS: 5.9 10*3/uL (ref 1.4–7.0)
Neutrophils: 62 %
PLATELETS: 412 10*3/uL — AB (ref 150–379)
RBC: 5.02 x10E6/uL (ref 3.77–5.28)
RDW: 14.5 % (ref 12.3–15.4)
WBC: 9.5 10*3/uL (ref 3.4–10.8)

## 2016-07-07 LAB — CMP14+EGFR
A/G RATIO: 1.2 (ref 1.2–2.2)
ALBUMIN: 3.9 g/dL (ref 3.5–5.5)
ALK PHOS: 66 IU/L (ref 39–117)
ALT: 8 IU/L (ref 0–32)
AST: 12 IU/L (ref 0–40)
BUN / CREAT RATIO: 14 (ref 9–23)
BUN: 9 mg/dL (ref 6–20)
CHLORIDE: 101 mmol/L (ref 96–106)
CO2: 23 mmol/L (ref 18–29)
Calcium: 9.7 mg/dL (ref 8.7–10.2)
Creatinine, Ser: 0.64 mg/dL (ref 0.57–1.00)
GFR calc non Af Amer: 126 mL/min/{1.73_m2} (ref >59)
GFR, EST AFRICAN AMERICAN: 145 mL/min/{1.73_m2} (ref >59)
GLUCOSE: 119 mg/dL — AB (ref 65–99)
Globulin, Total: 3.2 g/dL (ref 1.5–4.5)
Potassium: 4.2 mmol/L (ref 3.5–5.2)
Sodium: 140 mmol/L (ref 134–144)
TOTAL PROTEIN: 7.1 g/dL (ref 6.0–8.5)

## 2016-07-07 LAB — TSH: TSH: 1.65 u[IU]/mL (ref 0.450–4.500)

## 2016-07-13 ENCOUNTER — Other Ambulatory Visit (HOSPITAL_COMMUNITY): Payer: Self-pay | Admitting: Optometry

## 2016-07-13 DIAGNOSIS — M2550 Pain in unspecified joint: Secondary | ICD-10-CM | POA: Diagnosis not present

## 2016-07-13 DIAGNOSIS — H4710 Unspecified papilledema: Secondary | ICD-10-CM

## 2016-07-15 ENCOUNTER — Ambulatory Visit (HOSPITAL_COMMUNITY)
Admission: RE | Admit: 2016-07-15 | Discharge: 2016-07-15 | Disposition: A | Discharge status: Home / Self Care | Payer: BLUE CROSS/BLUE SHIELD | Source: Ambulatory Visit | Attending: Optometry | Admitting: Optometry

## 2016-07-15 DIAGNOSIS — H4710 Unspecified papilledema: Secondary | ICD-10-CM | POA: Diagnosis not present

## 2016-07-15 DIAGNOSIS — H4711 Papilledema associated with increased intracranial pressure: Secondary | ICD-10-CM | POA: Diagnosis not present

## 2016-07-15 MED ORDER — GADOBENATE DIMEGLUMINE 529 MG/ML IV SOLN
20.0000 mL | Freq: Once | INTRAVENOUS | Status: AC | PRN
  Administered 2016-07-15: 20 mL via INTRAVENOUS

## 2016-07-20 ENCOUNTER — Emergency Department (HOSPITAL_COMMUNITY)
Admission: EM | Admit: 2016-07-20 | Discharge: 2016-07-20 | Disposition: A | Discharge status: Home / Self Care | Payer: BLUE CROSS/BLUE SHIELD | Attending: Emergency Medicine | Admitting: Emergency Medicine

## 2016-07-20 ENCOUNTER — Emergency Department (HOSPITAL_COMMUNITY): Payer: BLUE CROSS/BLUE SHIELD

## 2016-07-20 ENCOUNTER — Encounter: Payer: Self-pay | Admitting: Family Medicine

## 2016-07-20 ENCOUNTER — Ambulatory Visit (INDEPENDENT_AMBULATORY_CARE_PROVIDER_SITE_OTHER): Payer: BLUE CROSS/BLUE SHIELD | Admitting: Family Medicine

## 2016-07-20 ENCOUNTER — Encounter (HOSPITAL_COMMUNITY): Payer: Self-pay | Admitting: Emergency Medicine

## 2016-07-20 VITALS — BP 142/92 | HR 136 | Temp 96.9°F

## 2016-07-20 DIAGNOSIS — R079 Chest pain, unspecified: Secondary | ICD-10-CM | POA: Insufficient documentation

## 2016-07-20 DIAGNOSIS — Z79899 Other long term (current) drug therapy: Secondary | ICD-10-CM | POA: Insufficient documentation

## 2016-07-20 DIAGNOSIS — R0789 Other chest pain: Secondary | ICD-10-CM

## 2016-07-20 DIAGNOSIS — R000 Tachycardia, unspecified: Secondary | ICD-10-CM | POA: Insufficient documentation

## 2016-07-20 DIAGNOSIS — R0602 Shortness of breath: Secondary | ICD-10-CM | POA: Diagnosis not present

## 2016-07-20 DIAGNOSIS — M791 Myalgia: Secondary | ICD-10-CM | POA: Insufficient documentation

## 2016-07-20 HISTORY — DX: Other intervertebral disc degeneration, lumbar region without mention of lumbar back pain or lower extremity pain: M51.369

## 2016-07-20 HISTORY — DX: Other intervertebral disc degeneration, lumbar region: M51.36

## 2016-07-20 HISTORY — DX: Insomnia, unspecified: G47.00

## 2016-07-20 HISTORY — DX: Generalized anxiety disorder: F41.1

## 2016-07-20 LAB — COMPREHENSIVE METABOLIC PANEL
ALBUMIN: 3.5 g/dL (ref 3.5–5.0)
ALK PHOS: 55 U/L (ref 38–126)
ALT: 14 U/L (ref 14–54)
AST: 15 U/L (ref 15–41)
Anion gap: 6 (ref 5–15)
BUN: 10 mg/dL (ref 6–20)
CO2: 26 mmol/L (ref 22–32)
Calcium: 8.9 mg/dL (ref 8.9–10.3)
Chloride: 103 mmol/L (ref 101–111)
Creatinine, Ser: 0.67 mg/dL (ref 0.44–1.00)
GFR calc Af Amer: >60 mL/min (ref >60)
GFR calc non Af Amer: >60 mL/min (ref >60)
GLUCOSE: 103 mg/dL — AB (ref 65–99)
POTASSIUM: 3.4 mmol/L — AB (ref 3.5–5.1)
SODIUM: 135 mmol/L (ref 135–145)
Total Bilirubin: 0.2 mg/dL — ABNORMAL LOW (ref 0.3–1.2)
Total Protein: 7.3 g/dL (ref 6.5–8.1)

## 2016-07-20 LAB — CBC WITH DIFFERENTIAL/PLATELET
BASOS ABS: 0 10*3/uL (ref 0.0–0.1)
BASOS PCT: 0 %
EOS ABS: 0.2 10*3/uL (ref 0.0–0.7)
Eosinophils Relative: 2 %
HCT: 41 % (ref 36.0–46.0)
HEMOGLOBIN: 13.5 g/dL (ref 12.0–15.0)
Lymphocytes Relative: 29 %
Lymphs Abs: 2.6 10*3/uL (ref 0.7–4.0)
MCH: 27.4 pg (ref 26.0–34.0)
MCHC: 32.9 g/dL (ref 30.0–36.0)
MCV: 83.3 fL (ref 78.0–100.0)
MONOS PCT: 9 %
Monocytes Absolute: 0.8 10*3/uL (ref 0.1–1.0)
NEUTROS ABS: 5.3 10*3/uL (ref 1.7–7.7)
NEUTROS PCT: 60 %
Platelets: 331 10*3/uL (ref 150–400)
RBC: 4.92 MIL/uL (ref 3.87–5.11)
RDW: 14.4 % (ref 11.5–15.5)
WBC: 8.9 10*3/uL (ref 4.0–10.5)

## 2016-07-20 LAB — POC URINE PREG, ED: Preg Test, Ur: NEGATIVE

## 2016-07-20 LAB — D-DIMER, QUANTITATIVE (NOT AT ARMC): D DIMER QUANT: 0.34 ug{FEU}/mL (ref 0.00–0.50)

## 2016-07-20 LAB — TROPONIN I: Troponin I: <0.03 ng/mL (ref <0.03)

## 2016-07-20 LAB — HCG, QUANTITATIVE, PREGNANCY

## 2016-07-20 NOTE — ED Provider Notes (Signed)
[facility] DEPT Provider Note   CSN: [id number] Arrival date & time: 07/20/16  1554     History   Chief Complaint Chief Complaint  Patient presents with  . Chest Pain    HPI TABATHIA MEDDINGS is a 26 y.o. female.   Chest Pain   This is a new problem. The current episode started 3 to 5 hours ago. The problem occurs constantly. The problem has been resolved. The pain is associated with breathing. The pain is present in the lateral region. The pain is at a severity of 6/10. The pain is mild. The quality of the pain is described as brief. The pain radiates to the upper back. Associated symptoms include shortness of breath. Pertinent negatives include no cough, no fever and no palpitations.    Past Medical History:  Diagnosis Date  . DDD (degenerative disc disease), lumbar   . Generalized anxiety disorder   . Insomnia   . Migraines     Patient Active Problem List   Diagnosis Date Noted  . DDD (degenerative disc disease), lumbar 12/09/2015  . Sleeping difficulty 07/29/2015  . Other migraine without status migrainosus, not intractable 07/29/2015  . Generalized anxiety disorder 01/28/2015  . Murmur     Past Surgical History:  Procedure Laterality Date  . LUMBAR DISC SURGERY  06/21/2016  . WISDOM TOOTH EXTRACTION      OB History    Gravida Para Term Preterm AB Living   0 0 0 0 0 0   SAB TAB Ectopic Multiple Live Births   0 0 0 0 0       Home Medications    Prior to Admission medications   Medication Sig Start Date End Date Taking? Authorizing Provider  DULoxetine (CYMBALTA) 60 MG capsule Take 1 capsule (60 mg total) by mouth daily. 05/07/16  Yes Timmothy Euler, MD  JOLESSA 0.15-0.03 MG tablet Take 1 tablet by mouth daily. 08/25/15  Yes Historical Provider, MD  metoprolol succinate (TOPROL-XL) 25 MG 24 hr tablet Take 1 tablet (25 mg total) by mouth daily. 07/06/16  Yes Timmothy Euler, MD  omeprazole (PRILOSEC) 20 MG capsule Take 20 mg by mouth daily as  needed (for indigestion).  10/29/15  Yes Historical Provider, MD  traZODone (DESYREL) 100 MG tablet TAKE ONE-HALF (1/2) TO 1 TABLET AT BEDTIME 12/31/15  Yes Timmothy Euler, MD    Family History Family History  Problem Relation Age of Onset  . Diabetes Mother   . Hypertension Mother     Social History Social History  Substance Use Topics  . Smoking status: Never Smoker  . Smokeless tobacco: Never Used  . Alcohol use No     Allergies   Erythromycin; Penicillins; and Tamiflu [oseltamivir phosphate]   Review of Systems Review of Systems  Constitutional: Negative for fatigue and fever.  Respiratory: Positive for shortness of breath. Negative for cough, wheezing and stridor.   Cardiovascular: Positive for chest pain. Negative for palpitations and leg swelling.  Musculoskeletal: Positive for arthralgias and myalgias.  All other systems reviewed and are negative.    Physical Exam Updated Vital Signs BP 130/75   Pulse 90   Temp 98.6 F (37 C) (Oral)   Resp 15   Ht 5\' 10"  (1.778 m)   Wt 240 lb (108.9 kg)   LMP 07/21/2015 Comment: continuous birth control  SpO2 100%   BMI 34.44 kg/m   Physical Exam  Constitutional: She is oriented to person, place, and time. She appears well-developed and well-nourished.  HENT:  Head: Normocephalic and atraumatic.  Eyes: Conjunctivae and EOM are normal.  Neck: Normal range of motion.  Cardiovascular: Regular rhythm.  Tachycardia present.   Pulmonary/Chest: Effort normal. No stridor. No respiratory distress.  Abdominal: Soft. She exhibits no distension.  Musculoskeletal: Normal range of motion. She exhibits no edema or deformity.  Neurological: She is alert and oriented to person, place, and time. No cranial nerve deficit.  Skin: Skin is warm and dry. No erythema. No pallor.  Nursing note and vitals reviewed.    ED Treatments / Results  Labs (all labs ordered are listed, but only abnormal results are displayed) Labs Reviewed    COMPREHENSIVE METABOLIC PANEL - Abnormal; Notable for the following:       Result Value   Potassium 3.4 (*)    Glucose, Bld 103 (*)    Total Bilirubin 0.2 (*)    All other components within normal limits  CBC WITH DIFFERENTIAL/PLATELET  TROPONIN I  D-DIMER, QUANTITATIVE (NOT AT [facility])  HCG, QUANTITATIVE, PREGNANCY  POC URINE PREG, ED    EKG  EKG Interpretation  Date/Time:  Tuesday July 20 2016 15:55:20 EST Ventricular Rate:  104 PR Interval:    QRS Duration: 80 QT Interval:  320 QTC Calculation: 421 R Axis:   74 Text Interpretation:  Sinus tachycardia Borderline T abnormalities, inferior leads No old tracing to compare Confirmed by [facility] MD, Panayiotis [place] [phone]) on 07/20/2016 4:16:12 PM       Radiology Dg Chest 2 View  Result Date: 07/20/2016 CLINICAL DATA:  Left-sided chest pain radiating to back. History of hypertension. Some shortness of breath today. EXAM: CHEST  2 VIEW COMPARISON:  Chest x-ray dated 10/21/2015. FINDINGS: Cardiomediastinal silhouette is normal in size and configuration. Lungs are clear. Lung volumes are normal. No evidence of pneumonia. No pleural effusion. No pneumothorax. Stable mild dextroscoliosis of the thoracic spine. No acute or suspicious osseous finding. IMPRESSION: No evidence of acute cardiopulmonary abnormality. No evidence of pneumonia or pulmonary edema. Electronically Signed   By: Franki Cabot M.D.   On: 07/20/2016 17:04    Procedures Procedures (including critical care time)  Medications Ordered in ED Medications - No data to display   Initial Impression / Assessment and Plan / ED Course  I have reviewed the triage vital signs and the nursing notes.  Pertinent labs & imaging results that were available during my care of the patient were reviewed by me and considered in my medical decision making (see chart for details).  Clinical Course    Sent from pcp for chest pain. Recent surgery, OCP's, pleuritic in nature, no cough or fever.  Concern for PE. Also with HTN, possibly dissection. Will eval with d dimer for PE. cxr to ensure no widening of mediastinum suggestive of dissection.  Workup negative for emergent causes. Plan for PCP follow up for continued workup.   Final Clinical Impressions(s) / ED Diagnoses   Final diagnoses:  Nonspecific chest pain    New Prescriptions Discharge Medication List as of 07/20/2016  6:14 PM       Merrily Pew, MD 07/20/16 [id number]

## 2016-07-20 NOTE — ED Triage Notes (Signed)
PT sent to ED from [facility] doctor office for eval today due to left sided chest stabbing pain that radiates to her back. PT states she has HTN and takes metoprolol. PT states some SOB today as well while she was driving to the MD office. PT denies any pain upon arrival to ED.

## 2016-07-20 NOTE — Progress Notes (Signed)
   HPI  Patient presents today with chest pain.  Patient explains that she had chest pain starting this morning described as left-sided chest pain in central chest pain radiating to her back. She's described it as a sharp stabbing pain. She had associated shortness of breath. The pain is now improving slightly.   She has had recent back surgery 3 weeks ago and also was on oral contraceptive pills.  Patient denies any leg swelling recently.  She has had improvement in her tachycardic symptoms after starting metoprolol and her rate has been in the 80s most of the time.   PMH: Smoking status noted ROS: Per HPI  Objective: BP (!) 142/92 (BP Location: Left Arm, Patient Position: Sitting, Cuff Size: Large)   Pulse (!) 136   Temp (!) 96.9 F (36.1 C)   SpO2 98%  Gen: NAD, alert, cooperative with exam HEENT: NCAT CV: Tachy, no murmur Chest wall: Left-sided sternocostal border with tenderness to palpation, however pain does not reproduce chest pain. Patient states it's a very different type of pain. Resp: CTABL, no wheezes, non-labored Ext: No edema, warm Neuro: Alert and oriented, No gross deficits  Assessment and plan:  # Chest pain Pleuritic chest pain with shortness of breath 3 weeks after surgery also with OCPs on board, patient is at very high risk for pulmonary embolism. Her exam is very reassuring and she appears to be doing well if she has had a PE. Considering the seriousness of the possible illness I have recommended transfer to the emergency room via ambulance. All questions answered, appreciate very much emergency rooms evaluation and management of the patient.  EKG- Sinus tachy   Orders Placed This Encounter  Procedures  . EKG 12-Lead    Laroy Apple, MD [facility] 07/20/2016, 3:01 PM

## 2016-07-23 DIAGNOSIS — Z9889 Other specified postprocedural states: Secondary | ICD-10-CM | POA: Insufficient documentation

## 2016-07-28 DIAGNOSIS — M2550 Pain in unspecified joint: Secondary | ICD-10-CM | POA: Diagnosis not present

## 2016-07-29 ENCOUNTER — Ambulatory Visit (INDEPENDENT_AMBULATORY_CARE_PROVIDER_SITE_OTHER): Payer: BLUE CROSS/BLUE SHIELD

## 2016-07-29 ENCOUNTER — Ambulatory Visit (INDEPENDENT_AMBULATORY_CARE_PROVIDER_SITE_OTHER): Payer: BLUE CROSS/BLUE SHIELD | Admitting: Adult Health

## 2016-07-29 ENCOUNTER — Encounter: Payer: Self-pay | Admitting: Adult Health

## 2016-07-29 VITALS — BP 144/100 | HR 123 | Ht 70.0 in | Wt 246.0 lb

## 2016-07-29 DIAGNOSIS — I10 Essential (primary) hypertension: Secondary | ICD-10-CM

## 2016-07-29 DIAGNOSIS — R000 Tachycardia, unspecified: Secondary | ICD-10-CM

## 2016-07-29 NOTE — Progress Notes (Signed)
Name: Heidi Gibbs    DOB: Jan 19, 1991  Age: 26 y.o.  MR#: [id number]       PCP:  Kenn File, MD      Insurance: Payor: [place] / Plan: [place] / Product Type: *No Product type* /   CC:   No chief complaint on file.   VS Vitals:   07/29/16 1449 07/29/16 1452  BP: 130/90 (!) 144/100  Pulse: (!) 123   SpO2: 92%   Weight: 246 lb (111.6 kg)   Height: 5\' 10"  (1.778 m)     Weights Current Weight  07/29/16 246 lb (111.6 kg)  07/20/16 240 lb (108.9 kg)  07/06/16 243 lb 6.4 oz (110.4 kg)    Blood Pressure  BP Readings from Last 3 Encounters:  07/29/16 (!) 144/100  07/20/16 130/75  07/20/16 (!) 142/92     Admit date:  (Not on file) Last encounter with RMR:  Visit date not found   Allergy Erythromycin; Penicillins; and Tamiflu [oseltamivir phosphate]  Current Outpatient Prescriptions  Medication Sig Dispense Refill  . DULoxetine (CYMBALTA) 60 MG capsule Take 1 capsule (60 mg total) by mouth daily. 30 capsule 5  . JOLESSA 0.15-0.03 MG tablet Take 1 tablet by mouth daily.    . metoprolol succinate (TOPROL-XL) 25 MG 24 hr tablet Take 1 tablet (25 mg total) by mouth daily. 30 tablet 1  . omeprazole (PRILOSEC) 20 MG capsule Take 20 mg by mouth daily as needed (for indigestion).   12  . traZODone (DESYREL) 100 MG tablet TAKE ONE-HALF (1/2) TO 1 TABLET AT BEDTIME 30 tablet 2   No current facility-administered medications for this visit.     Discontinued Meds:   There are no discontinued medications.  Patient Active Problem List   Diagnosis Date Noted  . DDD (degenerative disc disease), lumbar 12/09/2015  . Sleeping difficulty 07/29/2015  . Other migraine without status migrainosus, not intractable 07/29/2015  . Generalized anxiety disorder 01/28/2015  . Murmur     LABS    Component Value Date/Time   NA 135 07/20/2016 1615   NA 140 07/06/2016 1131   NA 140 03/15/2016 0823   NA 141 10/21/2015 0952   K 3.4 (L) 07/20/2016 1615   K 4.2 07/06/2016  1131   K 4.7 03/15/2016 0823   CL 103 07/20/2016 1615   CL 101 07/06/2016 1131   CL 102 03/15/2016 0823   CO2 26 07/20/2016 1615   CO2 23 07/06/2016 1131   CO2 22 03/15/2016 0823   GLUCOSE 103 (H) 07/20/2016 1615   GLUCOSE 119 (H) 07/06/2016 1131   GLUCOSE 84 03/15/2016 0823   GLUCOSE 86 10/21/2015 0952   BUN 10 07/20/2016 1615   BUN 9 07/06/2016 1131   BUN 10 03/15/2016 0823   BUN 8 10/21/2015 0952   CREATININE 0.67 07/20/2016 1615   CREATININE 0.64 07/06/2016 1131   CREATININE 0.68 03/15/2016 0823   CALCIUM 8.9 07/20/2016 1615   CALCIUM 9.7 07/06/2016 1131   CALCIUM 9.4 03/15/2016 0823   GFRNONAA >60 07/20/2016 1615   GFRNONAA 126 07/06/2016 1131   GFRNONAA 125 03/15/2016 0823   GFRAA >60 07/20/2016 1615   GFRAA 145 07/06/2016 1131   GFRAA 144 03/15/2016 0823   CMP     Component Value Date/Time   NA 135 07/20/2016 1615   NA 140 07/06/2016 1131   K 3.4 (L) 07/20/2016 1615   CL 103 07/20/2016 1615   CO2 26 07/20/2016 1615   GLUCOSE 103 (H) 07/20/2016  1615   BUN 10 07/20/2016 1615   BUN 9 07/06/2016 1131   CREATININE 0.67 07/20/2016 1615   CALCIUM 8.9 07/20/2016 1615   PROT 7.3 07/20/2016 1615   PROT 7.1 07/06/2016 1131   ALBUMIN 3.5 07/20/2016 1615   ALBUMIN 3.9 07/06/2016 1131   AST 15 07/20/2016 1615   ALT 14 07/20/2016 1615   ALKPHOS 55 07/20/2016 1615   BILITOT 0.2 (L) 07/20/2016 1615   BILITOT <0.2 07/06/2016 1131   GFRNONAA >60 07/20/2016 1615   GFRAA >60 07/20/2016 1615       Component Value Date/Time   WBC 8.9 07/20/2016 1615   WBC 9.5 07/06/2016 1131   WBC 8.5 03/15/2016 0823   WBC 8.8 10/21/2015 0952   WBC 11.8 (A) 12/29/2012 1219   WBC 5.0 11/06/2012 1100   HGB 13.5 07/20/2016 1615   HGB 14.4 12/29/2012 1219   HGB 14.3 11/06/2012 1100   HCT 41.0 07/20/2016 1615   HCT 41.0 07/06/2016 1131   HCT 38.5 03/15/2016 0823   HCT 40.8 10/21/2015 0952   MCV 83.3 07/20/2016 1615   MCV 82 07/06/2016 1131   MCV 82 03/15/2016 0823   MCV 85  10/21/2015 0952    Lipid Panel  No results found for: CHOL, TRIG, HDL, CHOLHDL, VLDL, LDLCALC, LDLDIRECT  ABG No results found for: PHART, PCO2ART, PO2ART, HCO3, TCO2, ACIDBASEDEF, O2SAT   Lab Results  Component Value Date   TSH 1.650 07/06/2016   BNP (last 3 results) No results for input(s): BNP in the last 8760 hours.  ProBNP (last 3 results) No results for input(s): PROBNP in the last 8760 hours.  Cardiac Panel (last 3 results) No results for input(s): CKTOTAL, CKMB, TROPONINI, RELINDX in the last 72 hours.  Iron/TIBC/Ferritin/ %Sat No results found for: IRON, TIBC, FERRITIN, IRONPCTSAT   EKG Orders placed or performed during the hospital encounter of 07/20/16  . EKG 12-Lead  . EKG 12-Lead  . ED EKG  . ED EKG     Prior Assessment and Plan Problem List as of 07/29/2016 Reviewed: 07/20/2016  3:05 PM by Kenn File, MD     Cardiovascular and Mediastinum   Other migraine without status migrainosus, not intractable     Musculoskeletal and Integument   DDD (degenerative disc disease), lumbar     Other   Murmur   Last Assessment & Plan 06/23/2012 Office Visit Written 06/23/2012  3:26 PM by Josue Hector, MD    She has no murmur today.  Reviewed echo and she has trivial MR with no prolapse.  Doubt rheumatic fever.  Advised f/u echo in 2 years or sooner if murmur "comes" back or heard again.  Possibility of flow murmur heard during febrile illness      Generalized anxiety disorder   Sleeping difficulty       Imaging: Dg Chest 2 View  Result Date: 07/20/2016 CLINICAL DATA:  Left-sided chest pain radiating to back. History of hypertension. Some shortness of breath today. EXAM: CHEST  2 VIEW COMPARISON:  Chest x-ray dated 10/21/2015. FINDINGS: Cardiomediastinal silhouette is normal in size and configuration. Lungs are clear. Lung volumes are normal. No evidence of pneumonia. No pleural effusion. No pneumothorax. Stable mild dextroscoliosis of the thoracic spine. No  acute or suspicious osseous finding. IMPRESSION: No evidence of acute cardiopulmonary abnormality. No evidence of pneumonia or pulmonary edema. Electronically Signed   By: Franki Cabot M.D.   On: 07/20/2016 17:04   Mr Jeri Cos [id number] Contrast  Result Date: 07/15/2016 CLINICAL DATA:  26 y/o  F; papilledema. EXAM: MRI HEAD AND ORBITS WITHOUT AND WITH CONTRAST TECHNIQUE: Multiplanar, multiecho pulse sequences of the brain and surrounding structures were obtained without and with intravenous contrast. Multiplanar, multiecho pulse sequences of the orbits and surrounding structures were obtained including fat saturation techniques, before and after intravenous contrast administration. CONTRAST:  93mL MULTIHANCE GADOBENATE DIMEGLUMINE 529 MG/ML IV SOLN COMPARISON:  12/29/2012 CT head. FINDINGS: MRI HEAD FINDINGS Brain: No acute infarction, hemorrhage, hydrocephalus, abnormal enhancement, extra-axial collection or mass lesion of the brain. Vascular: Normal flow voids. Skull and upper cervical spine: Normal marrow signal. Other: None. MRI ORBITS FINDINGS Orbits: Slight elevation of the optic nerve heads and flattening of posterior globes bilaterally. 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 No abnormal T2 signal or enhancement of the optic nerves, chiasm, or radiations. No intraorbital inflammatory process or mass is identified. Extraocular muscles are normal in size. No proptosis. Visualized sinuses: Mild maxillary mucosal thickening and small left maxillary mucous retention cyst. Soft tissues: Negative. Limited intracranial: No significant or unexpected finding. IMPRESSION: 1. 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 No orbital mass or inflammatory process identified. No abnormal signal or enhancement of the optic nerve complexes. 2. Normal MRI of the brain without abnormal enhancement. Electronically Signed   By: Kristine Garbe M.D.   On: 07/15/2016 17:36   Mr Rosealee Albee [id number] Contrast  Result Date: 07/15/2016 CLINICAL DATA:  26 y/o  F; papilledema. EXAM: MRI HEAD AND ORBITS WITHOUT AND WITH CONTRAST TECHNIQUE: Multiplanar, multiecho pulse sequences of the brain and surrounding structures were obtained without and with intravenous contrast. Multiplanar, multiecho pulse sequences of the orbits and surrounding structures were obtained including fat saturation techniques, before and after intravenous contrast administration. CONTRAST:  24mL MULTIHANCE GADOBENATE DIMEGLUMINE 529 MG/ML IV SOLN COMPARISON:  12/29/2012 CT head. FINDINGS: MRI HEAD FINDINGS Brain: No acute infarction, hemorrhage, hydrocephalus, abnormal enhancement, extra-axial collection or mass lesion of the brain. Vascular: Normal flow voids. Skull and upper cervical spine: Normal marrow signal. Other: None. MRI ORBITS FINDINGS Orbits: Slight elevation of the optic nerve heads and flattening of posterior globes bilaterally. 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 No abnormal T2 signal or enhancement of the optic nerves, chiasm, or radiations. No intraorbital inflammatory process or mass is identified. Extraocular muscles are normal in size. No proptosis. Visualized sinuses: Mild maxillary mucosal thickening and small left maxillary mucous retention cyst. Soft tissues: Negative. Limited intracranial: No significant or unexpected finding. IMPRESSION: 1. 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 No orbital mass or inflammatory process identified. No abnormal signal or enhancement of the optic nerve complexes. 2. Normal MRI of the brain without abnormal enhancement. Electronically Signed   By: Kristine Garbe M.D.   On: 07/15/2016 17:36

## 2016-07-29 NOTE — Patient Instructions (Signed)
Your physician recommends that you schedule a follow-up appointment with Dr. Johnsie Cancel after Test   Your physician recommends that you continue on your current medications as directed. Please refer to the Current Medication list given to you today.  Your physician has requested that you have an echocardiogram. Echocardiography is a painless test that uses sound waves to create images of your heart. It provides your doctor with information about the size and shape of your heart and how well your heart's chambers and valves are working. This procedure takes approximately one hour. There are no restrictions for this procedure.  Your physician has recommended that you wear an event monitor. Event monitors are medical devices that record the heart's electrical activity. Doctors most often [place] these monitors to diagnose arrhythmias. Arrhythmias are problems with the speed or rhythm of the heartbeat. The monitor is a small, portable device. You can wear one while you do your normal daily activities. This is usually used to diagnose what is causing palpitations/syncope (passing out).  If you need a refill on your cardiac medications before your next appointment, please call your pharmacy.  Thank you for choosing [facility]!

## 2016-07-29 NOTE — Progress Notes (Signed)
Cardiology Office Note   Date:  07/29/2016   ID:  DIONDRIA BIVONA, DOB [birth year], MRN [id number]  PCP:  Kenn File, MD  Cardiologist: Lamar Sprinkles, NP   No chief complaint on file.     History of Present Illness: Heidi Gibbs is a 26 y.o. 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 Was last seen by Dr. Johnsie Cancel, 06/2012 and was asymptomatic. Has been followed by her primary care physician Dr. Wendi Snipes. Was seen in his office on 07/20/2016 with complaints of chest pain, described as left-sided chest pain in the central area as well radiating to her back. Described it as sharp stabbing pain with associated shortness of breath. She was placed on metoprolol recently and had improvement and rapid heart rhythm. She is here at the request of primary care for ongoing evaluation of her discomfort. Echocardiogram was ordered. At the time of this office visit the patient had not had a completed yet.  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 Since having the surgery, she has had recurrent episodes of rapid HR, up to 120-130 bpm. Causes chest pain and dyspnea. Occurs with position change and minimal exertion. She has not been diagnosed with POTS officially. She continues to take metoprolol as directed. She states that her BP has been up and down. She admits to issues with anxiety since childhood.   Past Medical History:  Diagnosis Date  . DDD (degenerative disc disease), lumbar   . Generalized anxiety disorder   . Insomnia   . Migraines     Past Surgical History:  Procedure Laterality Date  . LUMBAR DISC SURGERY  06/21/2016  . WISDOM TOOTH EXTRACTION       Current Outpatient Prescriptions  Medication Sig Dispense Refill  . DULoxetine (CYMBALTA) 60 MG capsule Take 1 capsule (60 mg total) by  mouth daily. 30 capsule 5  . JOLESSA 0.15-0.03 MG tablet Take 1 tablet by mouth daily.    . metoprolol succinate (TOPROL-XL) 25 MG 24 hr tablet Take 1 tablet (25 mg total) by mouth daily. 30 tablet 1  . omeprazole (PRILOSEC) 20 MG capsule Take 20 mg by mouth daily as needed (for indigestion).   12  . traZODone (DESYREL) 100 MG tablet TAKE ONE-HALF (1/2) TO 1 TABLET AT BEDTIME 30 tablet 2   No current facility-administered medications for this visit.     Allergies:   Erythromycin; Penicillins; and Tamiflu [oseltamivir phosphate]    Social History:  The patient  reports that she has never smoked. She has never used smokeless tobacco. She reports that she does not drink alcohol or use drugs.   Family History:  The patient's family history includes Diabetes in her mother; Hypertension in her mother.    ROS: All other systems are reviewed and negative. Unless otherwise mentioned in H&P    PHYSICAL EXAM: VS:  BP (!) 144/100 (BP Location: Right Arm)   Pulse (!) 123   Ht 5\' 10"  (1.778 m)   Wt 246 lb (111.6 kg)   SpO2 92%   BMI 35.30 kg/m  , BMI Body mass index is 35.3 kg/m. GEN: Well nourished, well developed, in no acute distress  HEENT: normal  Neck: no JVD, carotid bruits, or masses Cardiac: RRR; no murmurs, rubs, or gallops,no edema  Respiratory:  clear to auscultation bilaterally, normal work of breathing GI: soft, nontender, nondistended, +  BS MS: no deformity or atrophy  Skin: warm and dry, no rash Neuro:  Strength and sensation are intact Psych: euthymic mood, full affect   EKG:   The ekg ordered today demonstrates NSR heart rate of 82 bpm.   Recent Labs: 07/06/2016: TSH 1.650 07/20/2016: ALT 14; BUN 10; Creatinine, Ser 0.67; Hemoglobin 13.5; Platelets 331; Potassium 3.4; Sodium 135      Wt Readings from Last 3 Encounters:  07/29/16 246 lb (111.6 kg)  07/20/16 240 lb (108.9 kg)  07/06/16 243 lb 6.4 oz (110.4 kg)      Other studies Reviewed: Additional studies/  records that were reviewed today include:  Echocardiogram: 06/23/2012 Left ventricle: The cavity size was normal. Systolic function was normal. The estimated ejection fraction was in the range of 60% to 65%. Wall motion was normal; there were no regional wall motion abnormalities. - Mitral valve: No prolapse  TSH: 1.650 (07/20/2016)  ASSESSMENT AND PLAN:  1.  Recurrent tachycardia:  Review of EKG does not have evidence of WPW, or AVRNT. Will place 2 week cardiac monitor for evaluation of recurrent tachycardia for morphology, duration, and rate. Continue metoprolol as directed. TSH was recently checked and found to be norma.  2. Hypertension: Has been elevated per patient report since lumbar surgery. It has gotten better with BB. Orthostatics were completed and found to be negative. HR remained essentially normal. No evidence of POTS. Check echo. Consider evaluation for OSA.   Current medicines are reviewed at length with the patient today.    Labs/ tests ordered today include:   Orders Placed This Encounter  Procedures  . EKG 12-Lead     Disposition:   FU with Dr. Johnsie Cancel in [place], patient request.  Signed, Jory Sims, NP  07/29/2016 3:45 PM    [facility] [address] [place], [place] [address] Phone: [phone]; Fax: [phone]

## 2016-08-02 ENCOUNTER — Ambulatory Visit (HOSPITAL_COMMUNITY)
Admission: RE | Admit: 2016-08-02 | Discharge: 2016-08-02 | Disposition: A | Discharge status: Home / Self Care | Payer: BLUE CROSS/BLUE SHIELD | Source: Ambulatory Visit | Attending: Adult Health | Admitting: Adult Health

## 2016-08-02 DIAGNOSIS — I10 Essential (primary) hypertension: Secondary | ICD-10-CM

## 2016-08-02 DIAGNOSIS — R000 Tachycardia, unspecified: Secondary | ICD-10-CM | POA: Diagnosis not present

## 2016-08-02 DIAGNOSIS — I119 Hypertensive heart disease without heart failure: Secondary | ICD-10-CM | POA: Insufficient documentation

## 2016-08-02 NOTE — Progress Notes (Signed)
*  PRELIMINARY RESULTS* Echocardiogram 2D Echocardiogram has been performed.  Leavy Cella 08/02/2016, 11:19 AM

## 2016-08-05 ENCOUNTER — Other Ambulatory Visit: Payer: Self-pay | Admitting: Family Medicine

## 2016-08-06 ENCOUNTER — Ambulatory Visit: Payer: BLUE CROSS/BLUE SHIELD | Admitting: Cardiovascular Disease

## 2016-08-06 DIAGNOSIS — R51 Headache: Secondary | ICD-10-CM | POA: Diagnosis not present

## 2016-08-06 DIAGNOSIS — H538 Other visual disturbances: Secondary | ICD-10-CM | POA: Diagnosis not present

## 2016-08-06 DIAGNOSIS — H4710 Unspecified papilledema: Secondary | ICD-10-CM | POA: Diagnosis not present

## 2016-08-06 DIAGNOSIS — H93A3 Pulsatile tinnitus, bilateral: Secondary | ICD-10-CM | POA: Diagnosis not present

## 2016-08-10 DIAGNOSIS — G932 Benign intracranial hypertension: Secondary | ICD-10-CM | POA: Diagnosis not present

## 2016-08-12 ENCOUNTER — Emergency Department (HOSPITAL_COMMUNITY)
Admission: EM | Admit: 2016-08-12 | Discharge: 2016-08-12 | Disposition: A | Discharge status: Home / Self Care | Payer: BLUE CROSS/BLUE SHIELD | Attending: Emergency Medicine | Admitting: Emergency Medicine

## 2016-08-12 ENCOUNTER — Encounter (HOSPITAL_COMMUNITY): Payer: Self-pay | Admitting: *Deleted

## 2016-08-12 DIAGNOSIS — R519 Headache, unspecified: Secondary | ICD-10-CM

## 2016-08-12 DIAGNOSIS — R112 Nausea with vomiting, unspecified: Secondary | ICD-10-CM | POA: Diagnosis not present

## 2016-08-12 DIAGNOSIS — R51 Headache: Secondary | ICD-10-CM | POA: Diagnosis not present

## 2016-08-12 DIAGNOSIS — G4489 Other headache syndrome: Secondary | ICD-10-CM

## 2016-08-12 DIAGNOSIS — G971 Other reaction to spinal and lumbar puncture: Secondary | ICD-10-CM | POA: Diagnosis not present

## 2016-08-12 DIAGNOSIS — Z79899 Other long term (current) drug therapy: Secondary | ICD-10-CM | POA: Diagnosis not present

## 2016-08-12 LAB — CBC WITH DIFFERENTIAL/PLATELET
BASOS PCT: 0 %
Basophils Absolute: 0 10*3/uL (ref 0.0–0.1)
Eosinophils Absolute: 0.1 10*3/uL (ref 0.0–0.7)
Eosinophils Relative: 0 %
HEMATOCRIT: 42.1 % (ref 36.0–46.0)
Hemoglobin: 13.9 g/dL (ref 12.0–15.0)
LYMPHS ABS: 2.5 10*3/uL (ref 0.7–4.0)
Lymphocytes Relative: 21 %
MCH: 27.5 pg (ref 26.0–34.0)
MCHC: 33 g/dL (ref 30.0–36.0)
MCV: 83.4 fL (ref 78.0–100.0)
MONOS PCT: 5 %
Monocytes Absolute: 0.6 10*3/uL (ref 0.1–1.0)
NEUTROS ABS: 8.8 10*3/uL — AB (ref 1.7–7.7)
Neutrophils Relative %: 74 %
Platelets: 362 10*3/uL (ref 150–400)
RBC: 5.05 MIL/uL (ref 3.87–5.11)
RDW: 14.4 % (ref 11.5–15.5)
WBC: 12 10*3/uL — AB (ref 4.0–10.5)

## 2016-08-12 LAB — COMPREHENSIVE METABOLIC PANEL
ALBUMIN: 3.7 g/dL (ref 3.5–5.0)
ALK PHOS: 57 U/L (ref 38–126)
ALT: 11 U/L — AB (ref 14–54)
ANION GAP: 8 (ref 5–15)
AST: 12 U/L — ABNORMAL LOW (ref 15–41)
BILIRUBIN TOTAL: 0.5 mg/dL (ref 0.3–1.2)
BUN: 8 mg/dL (ref 6–20)
CALCIUM: 9.3 mg/dL (ref 8.9–10.3)
CO2: 24 mmol/L (ref 22–32)
CREATININE: 0.65 mg/dL (ref 0.44–1.00)
Chloride: 106 mmol/L (ref 101–111)
GFR calc Af Amer: >60 mL/min (ref >60)
GFR calc non Af Amer: >60 mL/min (ref >60)
GLUCOSE: 96 mg/dL (ref 65–99)
Potassium: 3.9 mmol/L (ref 3.5–5.1)
SODIUM: 138 mmol/L (ref 135–145)
TOTAL PROTEIN: 7.6 g/dL (ref 6.5–8.1)

## 2016-08-12 MED ORDER — ONDANSETRON 4 MG PO TBDP: ORAL_TABLET | ORAL | 0 refills | Status: DC

## 2016-08-12 MED ORDER — SODIUM CHLORIDE 0.9 % IV BOLUS (SEPSIS)
2000.0000 mL | Freq: Once | INTRAVENOUS | Status: AC
  Administered 2016-08-12: 2000 mL via INTRAVENOUS

## 2016-08-12 MED ORDER — HYDROMORPHONE HCL 1 MG/ML IJ SOLN
1.0000 mg | Freq: Once | INTRAMUSCULAR | Status: AC
  Administered 2016-08-12: 1 mg via INTRAVENOUS
  Filled 2016-08-12: qty 1

## 2016-08-12 MED ORDER — HYDROCODONE-ACETAMINOPHEN 5-325 MG PO TABS: 1.0000 | ORAL_TABLET | Freq: Four times a day (QID) | ORAL | 0 refills | Status: DC | PRN

## 2016-08-12 MED ORDER — ONDANSETRON HCL 4 MG/2ML IJ SOLN
4.0000 mg | Freq: Once | INTRAMUSCULAR | Status: AC
  Administered 2016-08-12: 4 mg via INTRAVENOUS
  Filled 2016-08-12: qty 2

## 2016-08-12 MED ORDER — POVIDONE-IODINE 10 % EX SOLN
CUTANEOUS | Status: AC
  Filled 2016-08-12: qty 118

## 2016-08-12 NOTE — ED Notes (Signed)
Patient given discharge instruction, verbalized understand. IV removed, band aid applied. Patient  In wheelchair to bathroom and then out of the department.

## 2016-08-12 NOTE — ED Notes (Signed)
Pt laying on belly, father and grandmother remains with pt

## 2016-08-12 NOTE — ED Triage Notes (Signed)
Pt reports she had a lumbar puncture on Tuesday at [facility] due to idiopathic intracranial hypertension. Pt c/o nausea, vomiting, headache that started on Wednesday. Pt called the neuro surgeon today to report her symptoms and they advised pt to come to ED for blood patch. Pt denies dizziness, lightheadedness.

## 2016-08-12 NOTE — ED Provider Notes (Signed)
[facility] DEPT Provider Note   CSN: [id number] Arrival date & time: 08/12/16  1224   By signing my name below, I, Heidi Gibbs, attest that this documentation has been prepared under the direction and in the presence of Heidi Ferguson, MD. Electronically Signed: Collene Gibbs, Scribe. 08/12/16. 12:50 PM.  History   Chief Complaint Chief Complaint  Patient presents with  . Headache    HPI Comments: Heidi Gibbs is a 26 y.o. female who presents to the Emergency Department complaining of a headache that began yesterday. Patient reports having a lumbar puncture 2 days ago at [facility] due to idiopathic intracranial hypertension. Patient states her spinal fluid pressure was 32, they took enough off to make it go down to 14. Patient states she called her neurosurgeon to report her symptoms and she was advised to come to the ED for a blood patch. Patient has associated nausea, vomiting, and pain in the neck and shoulders. Patient states lying down improves her headache. No modifying factors indicated. Patient denies any dizziness, and lightheadedness.   The history is provided by the patient. No language interpreter was used.  Headache   This is a new problem. The current episode started yesterday. The problem occurs constantly. The problem has not changed since onset.Associated with: Recent lumbar puncture 2 days ago. The pain does not radiate. Associated symptoms include nausea and vomiting. She has tried nothing for the symptoms.    Past Medical History:  Diagnosis Date  . DDD (degenerative disc disease), lumbar   . Generalized anxiety disorder   . Insomnia   . Migraines     Patient Active Problem List   Diagnosis Date Noted  . DDD (degenerative disc disease), lumbar 12/09/2015  . Sleeping difficulty 07/29/2015  . Other migraine without status migrainosus, not intractable 07/29/2015  . Generalized anxiety disorder 01/28/2015  . Murmur     Past Surgical History:    Procedure Laterality Date  . LUMBAR DISC SURGERY  06/21/2016  . WISDOM TOOTH EXTRACTION      OB History    Gravida Para Term Preterm AB Living   0 0 0 0 0 0   SAB TAB Ectopic Multiple Live Births   0 0 0 0 0       Home Medications    Prior to Admission medications   Medication Sig Start Date End Date Taking? Authorizing Provider  DULoxetine (CYMBALTA) 60 MG capsule Take 1 capsule (60 mg total) by mouth daily. 05/07/16   Heidi Euler, MD  JOLESSA 0.15-0.03 MG tablet Take 1 tablet by mouth daily. 08/25/15   Historical Provider, MD  metoprolol succinate (TOPROL-XL) 25 MG 24 hr tablet Take 1 tablet (25 mg total) by mouth daily. 07/06/16   Heidi Euler, MD  omeprazole (PRILOSEC) 20 MG capsule Take 20 mg by mouth daily as needed (for indigestion).  10/29/15   Historical Provider, MD  traZODone (DESYREL) 100 MG tablet TAKE ONE-HALF (1/2) TO 1 TABLET AT BEDTIME 08/05/16   Heidi Euler, MD    Family History Family History  Problem Relation Age of Onset  . Diabetes Mother   . Hypertension Mother     Social History Social History  Substance Use Topics  . Smoking status: Never Smoker  . Smokeless tobacco: Never Used  . Alcohol use No     Allergies   Erythromycin; Penicillins; and Tamiflu [oseltamivir phosphate]   Review of Systems Review of Systems  Constitutional: Negative for appetite change and fatigue.  HENT: Negative for  congestion, ear discharge and sinus pressure.   Eyes: Negative for discharge.  Respiratory: Negative for cough.   Cardiovascular: Negative for chest pain.  Gastrointestinal: Positive for nausea and vomiting. Negative for abdominal pain and diarrhea.  Genitourinary: Negative for frequency and hematuria.  Musculoskeletal: Negative for back pain.  Skin: Negative for rash.  Neurological: Positive for headaches. Negative for seizures.  Psychiatric/Behavioral: Negative for hallucinations.     Physical Exam Updated Vital Signs BP 132/84    Pulse 98   Temp 97.6 F (36.4 C) (Oral)   Resp 20   Ht 5\' 10"  (1.778 m)   Wt 240 lb (108.9 kg)   LMP 08/13/2015 Comment: pt reports she takes continuous birth control pills and hasn't had menstrual cycle in a year  SpO2 98%   BMI 34.44 kg/m   Physical Exam  Constitutional: She is oriented to person, place, and time. She appears well-developed.  HENT:  Head: Normocephalic.  Eyes: Conjunctivae and EOM are normal. No scleral icterus.  Neck: Neck supple. No thyromegaly present.  Cardiovascular: Normal rate and regular rhythm.  Exam reveals no gallop and no friction rub.   No murmur heard. Pulmonary/Chest: No stridor. She has no wheezes. She has no rales. She exhibits no tenderness.  Abdominal: She exhibits no distension. There is no tenderness. There is no rebound.  Musculoskeletal: Normal range of motion. She exhibits no edema.  Well healed scar on the lumbar spine.   Lymphadenopathy:    She has no cervical adenopathy.  Neurological: She is oriented to person, place, and time. She exhibits normal muscle tone. Coordination normal.  Skin: No rash noted. No erythema.  Psychiatric: She has a normal mood and affect. Her behavior is normal.     ED Treatments / Results  DIAGNOSTIC STUDIES: Oxygen Saturation is 98% on RA, normal by my interpretation.    COORDINATION OF CARE: 1:20 PM Discussed treatment plan with pt at bedside and pt agreed to plan.  Labs (all labs ordered are listed, but only abnormal results are displayed) Labs Reviewed - No data to display  EKG  EKG Interpretation None       Radiology No results found.  Procedures Procedures (including critical care time)  Medications Ordered in ED Medications - No data to display   Initial Impression / Assessment and Plan / ED Course  I have reviewed the triage vital signs and the nursing notes.  Pertinent labs & imaging results that were available during my care of the patient were reviewed by me and considered  in my medical decision making (see chart for details).    Patient with post spinal headache.  She improved some with nausea pain meds and fluids and is scheduled tomorrow morning to get a blood patch at [facility]   Final Clinical Impressions(s) / ED Diagnoses   Final diagnoses:  None    New Prescriptions New Prescriptions   No medications on file   The chart was scribed for me under my direct supervision.  I personally performed the history, physical, and medical decision making and all procedures in the evaluation of this patient.Heidi Ferguson, MD 08/12/16 [phone]

## 2016-08-12 NOTE — Discharge Instructions (Addendum)
Follow up with [facility] imaging tomorrow at 7:15am.   [address],   [id number],    do not eat or drink anything after 3am

## 2016-08-13 ENCOUNTER — Ambulatory Visit
Admit: 2016-08-13 | Discharge: 2016-08-13 | Disposition: A | Discharge status: Home / Self Care | Payer: BLUE CROSS/BLUE SHIELD | Attending: Emergency Medicine | Admitting: Emergency Medicine

## 2016-08-13 DIAGNOSIS — G932 Benign intracranial hypertension: Secondary | ICD-10-CM | POA: Diagnosis not present

## 2016-08-13 DIAGNOSIS — G4489 Other headache syndrome: Secondary | ICD-10-CM

## 2016-08-13 MED ORDER — IOPAMIDOL (ISOVUE-M 200) INJECTION 41%
1.0000 mL | Freq: Once | INTRAMUSCULAR | Status: AC
  Administered 2016-08-13: 1 mL via EPIDURAL

## 2016-08-13 MED ORDER — DIAZEPAM 5 MG PO TABS
10.0000 mg | ORAL_TABLET | Freq: Once | ORAL | Status: AC
  Administered 2016-08-13: 10 mg via ORAL

## 2016-08-13 NOTE — Discharge Instructions (Addendum)
Epidural Blood Patch Discharge Instructions  1. Go home and rest quietly for the next 24 hours.  It is important to lie flat for the next 24 hours.  Get up only to go to the restroom.  You may lie in the bed or on a couch on your back, your stomach, your left side or your right side.  You may have one pillow under your head.  You may have pillows between your knees while you are on your side or under your knees while you are on your back.  2. DO NOT drive today.  Recline the seat as far back as it will go, while still wearing your seat belt, on the way home.  3. You may get up to go to the bathroom as needed.  You may sit up for 10 minutes to eat.  You may resume your normal diet and medications unless otherwise indicated.  Drink plenty of extra fluids today and tomorrow.  Caffeine may help your body make spinal fluid faster, so add or increase your caffeine intake as much as possible.    4. You may resume normal activities after your 24 hours of bed rest is over; however, do not exert yourself strongly or do any heavy lifting tomorrow.  5.   Call us at [phone] with any questions or concerns.

## 2016-08-13 NOTE — Progress Notes (Signed)
20cc blood drawn from left AC space for Epidural Blood Patch; site unremarkable.  jkl

## 2016-08-22 NOTE — Progress Notes (Signed)
Cardiology Office Note   Date:  09/02/2016   ID:  Heidi Gibbs, DOB May 03, 1991, MRN [id number]  PCP:  Kenn File, MD  Cardiologist: Oswaldo Conroy, MD   No chief complaint on file.     History of Present Illness: Heidi Gibbs is a 26 y.o. 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 Was last seen by me  06/2012 and was asymptomatic. Has been followed by her primary care physician Dr. Wendi Snipes. Was seen in his office on 07/20/2016 with complaints of chest pain, described as left-sided chest pain in the central area as well radiating to her back. Described it as sharp stabbing pain with associated shortness of breath. She was placed on metoprolol recently and had improvement and rapid heart rhythm. She is here at the request of primary care for ongoing evaluation of her discomfort. Echocardiogram was ordered. At the time of this office visit the patient had not had a completed yet.  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 Since having the surgery, she has had recurrent episodes of rapid HR, up to 120-130 bpm. Causes chest pain and dyspnea. Occurs with position change and minimal exertion. She has not been diagnosed with POTS officially. She continues to take metoprolol as directed. She states that her BP has been up and down. She admits to issues with anxiety since childhood.   Seen at [facility] for [facility] 08/10/16 spinal fluid pressure 32 with ER visit 2/8 headache had blood patch next day   Echo done 08/02/16 benign  Study Conclusions  - Left ventricle: The cavity size was normal. Wall thickness was   increased in a pattern of mild LVH. Systolic function was normal.   The estimated ejection fraction was in the range of 60% to 65%.   Wall motion was normal; there were no regional wall motion  abnormalities. Left ventricular diastolic function parameters   were normal. - Atrial septum: No defect or patent foramen ovale was identified.  Monitor : reviewed Sinus Rhythm Sinus tachycardia no arrhythmia     Past Medical History:  Diagnosis Date  . DDD (degenerative disc disease), lumbar   . Generalized anxiety disorder   . Insomnia   . Migraines     Past Surgical History:  Procedure Laterality Date  . LUMBAR DISC SURGERY  06/21/2016  . WISDOM TOOTH EXTRACTION       Current Outpatient Prescriptions  Medication Sig Dispense Refill  . DULoxetine (CYMBALTA) 60 MG capsule Take 1 capsule (60 mg total) by mouth daily. 30 capsule 5  . JOLESSA 0.15-0.03 MG tablet Take 1 tablet by mouth daily.    . metoprolol succinate (TOPROL-XL) 25 MG 24 hr tablet Take 1 tablet (25 mg total) by mouth daily. 30 tablet 1  . omeprazole (PRILOSEC) 20 MG capsule Take 20 mg by mouth daily as needed (for indigestion).   12  . ondansetron (ZOFRAN ODT) 4 MG disintegrating tablet 4mg  ODT q4 hours prn nausea/vomit 12 tablet 0  . traZODone (DESYREL) 100 MG tablet TAKE ONE-HALF (1/2) TO 1 TABLET AT BEDTIME 30 tablet 5   No current facility-administered medications for this visit.     Allergies:   Erythromycin; Penicillins; and Tamiflu [oseltamivir phosphate]    Social History:  The patient  reports that she has never smoked. She has never used smokeless tobacco. She reports that she does not drink  alcohol or use drugs.   Family History:  The patient's family history includes Diabetes in her mother; Hypertension in her mother.    ROS: All other systems are reviewed and negative. Unless otherwise mentioned in H&P    PHYSICAL EXAM: VS:  BP 120/78   Pulse 99   Ht 5\' 10"  (1.778 m)   Wt 249 lb (112.9 kg)   SpO2 98%   BMI 35.73 kg/m  , BMI Body mass index is 35.73 kg/m. GEN: Well nourished, well developed, in no acute distress  HEENT: normal  Neck: no JVD, carotid bruits, or masses Cardiac: RRR;  no murmurs, rubs, or gallops,no edema  Respiratory:  clear to auscultation bilaterally, normal work of breathing GI: soft, nontender, nondistended, + BS MS: no deformity or atrophy  Skin: warm and dry, no rash Neuro:  Strength and sensation are intact Psych: euthymic mood, full affect   EKG:   The ekg ordered today demonstrates NSR heart rate of 82 bpm.   Recent Labs: 07/06/2016: TSH 1.650 08/12/2016: ALT 11; BUN 8; Creatinine, Ser 0.65; Hemoglobin 13.9; Platelets 362; Potassium 3.9; Sodium 138      Wt Readings from Last 3 Encounters:  09/02/16 249 lb (112.9 kg)  08/12/16 240 lb (108.9 kg)  07/29/16 246 lb (111.6 kg)      Other studies Reviewed: Additional studies/ records that were reviewed today include:  Echocardiogram: 06/23/2012 Left ventricle: The cavity size was normal. Systolic function was normal. The estimated ejection fraction was in the range of 60% to 65%. Wall motion was normal; there were no regional wall motion abnormalities. - Mitral valve: No prolapse  TSH: 1.650 (07/20/2016)  ASSESSMENT AND PLAN:  1.  Recurrent tachycardia:  Review of EKG does not have evidence of WPW, or AVRNT.  Continue metoprolol as directed. TSH was recently checked and found to be normal. Monitor with no arrhythmias   2. Hypertension: Has been elevated per patient report since lumbar surgery. It has gotten better with BB. Orthostatics were completed and found to be negative. HR remained essentially normal. No evidence of POTS.   3. Anxiety / Depression:  Continue cymbalta   4. Headache/Neuro: post lumbar patch 08/13/16    Current medicines are reviewed at length with the patient today.    Labs/ tests ordered today include:   No orders of the defined types were placed in this encounter.    Jenkins Rouge

## 2016-09-02 ENCOUNTER — Encounter: Payer: Self-pay | Admitting: Cardiovascular Disease

## 2016-09-02 ENCOUNTER — Ambulatory Visit (INDEPENDENT_AMBULATORY_CARE_PROVIDER_SITE_OTHER): Payer: BLUE CROSS/BLUE SHIELD | Admitting: Cardiovascular Disease

## 2016-09-02 VITALS — BP 120/78 | HR 99 | Ht 70.0 in | Wt 249.0 lb

## 2016-09-02 DIAGNOSIS — I10 Essential (primary) hypertension: Secondary | ICD-10-CM | POA: Diagnosis not present

## 2016-09-02 MED ORDER — METOPROLOL SUCCINATE ER 25 MG PO TB24: 25.0000 mg | ORAL_TABLET | Freq: Every day | ORAL | 3 refills | Status: DC

## 2016-09-02 NOTE — Patient Instructions (Signed)

## 2016-09-03 DIAGNOSIS — R05 Cough: Secondary | ICD-10-CM | POA: Diagnosis not present

## 2016-09-03 DIAGNOSIS — J111 Influenza due to unidentified influenza virus with other respiratory manifestations: Secondary | ICD-10-CM | POA: Diagnosis not present

## 2016-09-03 DIAGNOSIS — J029 Acute pharyngitis, unspecified: Secondary | ICD-10-CM | POA: Diagnosis not present

## 2016-09-06 DIAGNOSIS — R05 Cough: Secondary | ICD-10-CM | POA: Diagnosis not present

## 2016-09-06 DIAGNOSIS — J111 Influenza due to unidentified influenza virus with other respiratory manifestations: Secondary | ICD-10-CM | POA: Diagnosis not present

## 2016-09-06 DIAGNOSIS — J029 Acute pharyngitis, unspecified: Secondary | ICD-10-CM | POA: Diagnosis not present

## 2016-10-13 DIAGNOSIS — G932 Benign intracranial hypertension: Secondary | ICD-10-CM | POA: Diagnosis not present

## 2016-10-20 DIAGNOSIS — R208 Other disturbances of skin sensation: Secondary | ICD-10-CM | POA: Diagnosis not present

## 2016-10-20 DIAGNOSIS — G932 Benign intracranial hypertension: Secondary | ICD-10-CM | POA: Diagnosis not present

## 2016-10-20 DIAGNOSIS — Z6835 Body mass index (BMI) 35.0-35.9, adult: Secondary | ICD-10-CM | POA: Diagnosis not present

## 2016-10-20 DIAGNOSIS — M797 Fibromyalgia: Secondary | ICD-10-CM | POA: Insufficient documentation

## 2016-10-20 DIAGNOSIS — R102 Pelvic and perineal pain: Secondary | ICD-10-CM | POA: Diagnosis not present

## 2016-10-21 ENCOUNTER — Encounter: Payer: Self-pay | Admitting: Family Medicine

## 2016-10-21 ENCOUNTER — Ambulatory Visit (INDEPENDENT_AMBULATORY_CARE_PROVIDER_SITE_OTHER): Payer: BLUE CROSS/BLUE SHIELD | Admitting: Family Medicine

## 2016-10-21 VITALS — BP 132/77 | HR 100 | Temp 98.2°F | Ht 70.0 in | Wt 246.2 lb

## 2016-10-21 DIAGNOSIS — M79671 Pain in right foot: Secondary | ICD-10-CM

## 2016-10-21 DIAGNOSIS — M79672 Pain in left foot: Secondary | ICD-10-CM | POA: Diagnosis not present

## 2016-10-21 DIAGNOSIS — M7061 Trochanteric bursitis, right hip: Secondary | ICD-10-CM

## 2016-10-21 MED ORDER — NAPROXEN 500 MG PO TABS: 500.0000 mg | ORAL_TABLET | Freq: Two times a day (BID) | ORAL | 0 refills | Status: DC

## 2016-10-21 NOTE — Progress Notes (Signed)
   HPI  Patient presents today here with leg pain and foot pain.  Patient describes bilateral lateral hip pain right greater than left, this is been going on for a few weeks. It's worse after walking a long period of time. Pain described as lateral sharp hip pain radiating down the lateral leg.  Back pain is much better after surgery. Anxiety is completely resolved, Cymbalta has been discontinued by patient.  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 Patient frequently changes her shoes due to "flat feet". Naprosyn helps when pain is most severe  PMH: Smoking status noted ROS: Per HPI  Objective: BP 132/77   Pulse 100   Temp 98.2 F (36.8 C) (Oral)   Ht 5\' 10"  (1.778 m)   Wt 246 lb 3.2 oz (111.7 kg)   BMI 35.33 kg/m  Gen: NAD, alert, cooperative with exam HEENT: NCAT CV: RRR, good S1/S2, Resp: CTABL, no wheezes, non-labored Ext: No edema, warm Neuro: Alert and oriented, No gross deficits MSK Jaquelyn Bitter to palpation over bilateral greater trochanters, right greater than left Negative Faber test  No tenderness to palpation of the insertion of the plantar fascial bilaterally, no gross deformity, swelling, or erythema of the feet. No tenderness to palpation along bony landmarks of medial foot bilaterally  When standing moderately decreased arches  Assessment and plan:  # Greater trochanteric bursitis, right greater than left Conservative therapy for now including NSAIDs 10 days, ice, stretches Handout given from sports medicine patient advisor New problem  # Foot pain Unclear etiology, however likely due to overuse with poor support in shoes. Recommended sports insoles, if this is not improved would recommend sports medicine referral to consider orthotics. She has mildly decreased arches bilaterally on standing Also new problem- X 1 month   Meds ordered this encounter  Medications  . acetaZOLAMIDE (DIAMOX) 125  MG tablet    Sig: Take 125 mg by mouth.  . naproxen (NAPROSYN) 500 MG tablet    Sig: Take 1 tablet (500 mg total) by mouth 2 (two) times daily with a meal.    Dispense:  20 tablet    Refill:  0    Laroy Apple, MD [facility] 10/21/2016, 12:12 PM

## 2016-10-21 NOTE — Patient Instructions (Signed)
Great to see you!  Try ice 20 minutes, 3 times daily.  Take naproxen with food 2 times daily for 10 days  Come back if it is not getting better

## 2016-11-09 ENCOUNTER — Other Ambulatory Visit: Payer: Self-pay | Admitting: Family Medicine

## 2016-11-24 DIAGNOSIS — G932 Benign intracranial hypertension: Secondary | ICD-10-CM | POA: Diagnosis not present

## 2016-12-03 DIAGNOSIS — N94819 Vulvodynia, unspecified: Secondary | ICD-10-CM | POA: Diagnosis not present

## 2016-12-03 DIAGNOSIS — G8929 Other chronic pain: Secondary | ICD-10-CM | POA: Diagnosis not present

## 2016-12-03 DIAGNOSIS — R102 Pelvic and perineal pain: Secondary | ICD-10-CM | POA: Diagnosis not present

## 2016-12-03 DIAGNOSIS — N9489 Other specified conditions associated with female genital organs and menstrual cycle: Secondary | ICD-10-CM | POA: Diagnosis not present

## 2016-12-07 ENCOUNTER — Ambulatory Visit (INDEPENDENT_AMBULATORY_CARE_PROVIDER_SITE_OTHER): Payer: BLUE CROSS/BLUE SHIELD | Admitting: Physician Assistant

## 2016-12-07 ENCOUNTER — Encounter: Payer: Self-pay | Admitting: Physician Assistant

## 2016-12-07 ENCOUNTER — Telehealth: Payer: Self-pay | Admitting: Physician Assistant

## 2016-12-07 VITALS — BP 130/84 | HR 108 | Temp 97.4°F | Ht 70.0 in | Wt 235.0 lb

## 2016-12-07 DIAGNOSIS — J40 Bronchitis, not specified as acute or chronic: Secondary | ICD-10-CM | POA: Diagnosis not present

## 2016-12-07 MED ORDER — PREDNISONE 10 MG (21) PO TBPK: ORAL_TABLET | ORAL | 0 refills | Status: DC

## 2016-12-07 MED ORDER — DOXYCYCLINE HYCLATE 100 MG PO TABS: 100.0000 mg | ORAL_TABLET | Freq: Two times a day (BID) | ORAL | 0 refills | Status: DC

## 2016-12-07 NOTE — Telephone Encounter (Signed)
Patient questioned taking doxycycline with intracraniel hypertension.  She was advised to take medication and it would work fine ,per provider.

## 2016-12-07 NOTE — Progress Notes (Signed)
BP 130/84   Pulse (!) 108   Temp 97.4 F (36.3 C) (Oral)   Ht 5\' 10"  (1.778 m)   Wt 235 lb (106.6 kg)   BMI 33.72 kg/m    Subjective:    Patient ID: Heidi Gibbs, female    DOB: October 23, 1990, 26 y.o.   MRN: [id number]  HPI: Heidi Gibbs is a 26 y.o. female presenting on 12/07/2016 for Cough (started yesterday, started as a ST which got better, feels like she has a fever, chest congestion, productive, OTC advil congestion & mucinex)  Patient with several days of progressing upper respiratory and bronchial symptoms. Initially there was more upper respiratory congestion. This progressed to having significant cough that is productive throughout the day and severe at night. There is occasional wheezing after coughing. Sometimes there is slight dyspnea on exertion. It is productive mucus that is yellow in color. Denies any blood.  Relevant past medical, surgical, family and social history reviewed and updated as indicated. Allergies and medications reviewed and updated.  Past Medical History:  Diagnosis Date  . Brain tumor (benign) ([place])   . DDD (degenerative disc disease), lumbar   . Generalized anxiety disorder   . Insomnia   . Migraines     Past Surgical History:  Procedure Laterality Date  . LUMBAR DISC SURGERY  06/21/2016  . WISDOM TOOTH EXTRACTION      Review of Systems  Constitutional: Positive for fatigue. Negative for activity change, appetite change and chills.  HENT: Positive for congestion, postnasal drip and sore throat.   Eyes: Negative.   Respiratory: Positive for cough and wheezing. Negative for shortness of breath.   Cardiovascular: Negative.  Negative for chest pain, palpitations and leg swelling.  Gastrointestinal: Negative.   Genitourinary: Negative.   Musculoskeletal: Negative.   Skin: Negative.   Neurological: Positive for headaches.    Allergies as of 12/07/2016      Reactions   Erythromycin Hives   Penicillins Nausea And Vomiting   Has  patient had a PCN reaction causing immediate rash, facial/tongue/throat swelling, SOB or lightheadedness with hypotension: No Has patient had a PCN reaction causing severe rash involving mucus membranes or skin necrosis: No Has patient had a PCN reaction that required hospitalization No Has patient had a PCN reaction occurring within the last 10 years: Yes If all of the above answers are "NO", then may proceed with Cephalosporin use.   Tamiflu [oseltamivir Phosphate] Nausea And Vomiting      Medication List       Accurate as of 12/07/16  3:10 PM. Always use your most recent med list.          acetaZOLAMIDE 500 MG capsule Commonly known as:  DIAMOX Take 500 mg by mouth 2 (two) times daily.   doxycycline 100 MG tablet Commonly known as:  VIBRA-TABS Take 1 tablet (100 mg total) by mouth 2 (two) times daily.   JOLESSA 0.15-0.03 MG tablet Generic drug:  levonorgestrel-ethinyl estradiol Take 1 tablet by mouth daily.   metoprolol succinate 25 MG 24 hr tablet Commonly known as:  TOPROL-XL Take 1 tablet (25 mg total) by mouth daily.   omeprazole 20 MG capsule Commonly known as:  PRILOSEC Take 20 mg by mouth daily as needed (for indigestion).   predniSONE 10 MG (21) Tbpk tablet Commonly known as:  STERAPRED UNI-PAK 21 TAB As directed x 6 days   traZODone 100 MG tablet Commonly known as:  DESYREL TAKE ONE-HALF (1/2) TO 1 TABLET AT BEDTIME  Objective:    BP 130/84   Pulse (!) 108   Temp 97.4 F (36.3 C) (Oral)   Ht 5\' 10"  (1.778 m)   Wt 235 lb (106.6 kg)   BMI 33.72 kg/m   Allergies  Allergen Reactions  . Erythromycin Hives  . Penicillins Nausea And Vomiting    Has patient had a PCN reaction causing immediate rash, facial/tongue/throat swelling, SOB or lightheadedness with hypotension: No Has patient had a PCN reaction causing severe rash involving mucus membranes or skin necrosis: No Has patient had a PCN reaction that required hospitalization No Has  patient had a PCN reaction occurring within the last 10 years: Yes If all of the above answers are "NO", then may proceed with Cephalosporin use.   . Tamiflu [Oseltamivir Phosphate] Nausea And Vomiting    Physical Exam  Constitutional: She is oriented to person, place, and time. She appears well-developed and well-nourished.  HENT:  Head: Normocephalic and atraumatic.  Right Ear: There is drainage and tenderness.  Left Ear: There is drainage and tenderness.  Nose: Mucosal edema and rhinorrhea present. Right sinus exhibits maxillary sinus tenderness and frontal sinus tenderness. Left sinus exhibits maxillary sinus tenderness and frontal sinus tenderness.  Mouth/Throat: Oropharyngeal exudate and posterior oropharyngeal erythema present.  Eyes: Conjunctivae and EOM are normal. Pupils are equal, round, and reactive to light.  Neck: Normal range of motion. Neck supple.  Cardiovascular: Normal rate, regular rhythm, normal heart sounds and intact distal pulses.   Pulmonary/Chest: Effort normal. She has wheezes in the right upper field and the left upper field.  Abdominal: Soft. Bowel sounds are normal.  Neurological: She is alert and oriented to person, place, and time. She has normal reflexes.  Skin: Skin is warm and dry. No rash noted.  Psychiatric: She has a normal mood and affect. Her behavior is normal. Judgment and thought content normal.  Nursing note and vitals reviewed.       Assessment & Plan:   1. Bronchitis - doxycycline (VIBRA-TABS) 100 MG tablet; Take 1 tablet (100 mg total) by mouth 2 (two) times daily.  Dispense: 20 tablet; Refill: 0 - predniSONE (STERAPRED UNI-PAK 21 TAB) 10 MG (21) TBPK tablet; As directed x 6 days  Dispense: 21 tablet; Refill: 0   Current Outpatient Prescriptions:  .  acetaZOLAMIDE (DIAMOX) 500 MG capsule, Take 500 mg by mouth 2 (two) times daily., Disp: , Rfl:  .  JOLESSA 0.15-0.03 MG tablet, Take 1 tablet by mouth daily., Disp: , Rfl:  .  metoprolol  succinate (TOPROL-XL) 25 MG 24 hr tablet, Take 1 tablet (25 mg total) by mouth daily., Disp: 90 tablet, Rfl: 3 .  omeprazole (PRILOSEC) 20 MG capsule, Take 20 mg by mouth daily as needed (for indigestion). , Disp: , Rfl: 12 .  traZODone (DESYREL) 100 MG tablet, TAKE ONE-HALF (1/2) TO 1 TABLET AT BEDTIME, Disp: 30 tablet, Rfl: 5 .  doxycycline (VIBRA-TABS) 100 MG tablet, Take 1 tablet (100 mg total) by mouth 2 (two) times daily., Disp: 20 tablet, Rfl: 0 .  predniSONE (STERAPRED UNI-PAK 21 TAB) 10 MG (21) TBPK tablet, As directed x 6 days, Disp: 21 tablet, Rfl: 0  Continue all other maintenance medications as listed above.  Follow up plan: Return if symptoms worsen or fail to improve.  Educational handout given for bronchitis  Terald Sleeper PA-C [facility] [address]  [place], [place] [address] [phone]   12/07/2016, 3:10 PM

## 2016-12-07 NOTE — Patient Instructions (Signed)

## 2016-12-08 DIAGNOSIS — Z6833 Body mass index (BMI) 33.0-33.9, adult: Secondary | ICD-10-CM | POA: Diagnosis not present

## 2016-12-08 DIAGNOSIS — G932 Benign intracranial hypertension: Secondary | ICD-10-CM | POA: Diagnosis not present

## 2016-12-08 DIAGNOSIS — R0683 Snoring: Secondary | ICD-10-CM | POA: Diagnosis not present

## 2016-12-08 DIAGNOSIS — E6609 Other obesity due to excess calories: Secondary | ICD-10-CM | POA: Diagnosis not present

## 2016-12-13 ENCOUNTER — Ambulatory Visit: Payer: BLUE CROSS/BLUE SHIELD | Admitting: Physical Therapy

## 2017-01-04 DIAGNOSIS — G932 Benign intracranial hypertension: Secondary | ICD-10-CM | POA: Diagnosis not present

## 2017-01-13 DIAGNOSIS — R87612 Low grade squamous intraepithelial lesion on cytologic smear of cervix (LGSIL): Secondary | ICD-10-CM | POA: Diagnosis not present

## 2017-01-13 DIAGNOSIS — Z6835 Body mass index (BMI) 35.0-35.9, adult: Secondary | ICD-10-CM | POA: Diagnosis not present

## 2017-03-01 DIAGNOSIS — G932 Benign intracranial hypertension: Secondary | ICD-10-CM | POA: Diagnosis not present

## 2017-03-03 ENCOUNTER — Other Ambulatory Visit: Payer: Self-pay | Admitting: Family Medicine

## 2017-03-17 ENCOUNTER — Ambulatory Visit (INDEPENDENT_AMBULATORY_CARE_PROVIDER_SITE_OTHER): Payer: BLUE CROSS/BLUE SHIELD | Admitting: Family Medicine

## 2017-03-17 ENCOUNTER — Ambulatory Visit (INDEPENDENT_AMBULATORY_CARE_PROVIDER_SITE_OTHER): Payer: BLUE CROSS/BLUE SHIELD

## 2017-03-17 ENCOUNTER — Encounter: Payer: Self-pay | Admitting: Family Medicine

## 2017-03-17 VITALS — BP 137/82 | HR 100 | Temp 98.2°F | Ht 70.0 in | Wt 235.0 lb

## 2017-03-17 DIAGNOSIS — M542 Cervicalgia: Secondary | ICD-10-CM | POA: Diagnosis not present

## 2017-03-17 DIAGNOSIS — M5412 Radiculopathy, cervical region: Secondary | ICD-10-CM

## 2017-03-17 MED ORDER — PREDNISONE 20 MG PO TABS: 40.0000 mg | ORAL_TABLET | Freq: Every day | ORAL | 0 refills | Status: DC

## 2017-03-17 NOTE — Progress Notes (Signed)
   HPI  Patient presents today with neck pain.  Patient explains that she was previously scanned with MRI and found to have bulging disc at C4/C5. She states that she was practically asymptomatic at the time, however over the last 3 weeks has had severe and worsening left-sided neck pain with radiation down to her left arm medially crossing the antecubital fossa.  Patient also complains of some numbness and tingling in the hands bilaterally but weakness in the left hand. She has no loss of function or change in her ability to perform normal test.  She has pain medication available at home which she has been avoiding taking.  PMH: Smoking status noted ROS: Per HPI  Objective: BP 137/82   Pulse 100   Temp 98.2 F (36.8 C) (Oral)   Ht 5\' 10"  (1.778 m)   Wt 235 lb (106.6 kg)   BMI 33.72 kg/m  Gen: NAD, alert, cooperative with exam HEENT: NCAT, CV: RRR, good S1/S2, no murmur Resp: CTABL, no wheezes, non-labored Ext: No edema, warm Neuro: Alert and oriented, strength 5/5 and sensation intact in bilateral grip MSK:  Mild tenderness to palpation of paraspinal muscles of the left neck. No tenderness to palpation of the bony or muscular structures of the left shoulder.  Plain film: No acute findings, loss of normal curvature  Assessment and plan:  # Cervical radiculopathy Treat with short course of prednisone I recommended that the patient follow-up with her neurosurgeon previously did the MRI. Discussed supportive care. Likely needs repeat MRI  MRI cervical spine from 04/07/2016 shows small left paracentral disc bulge without significant spinal canal stenosis or neural foraminal narrowing at C3/C4     Orders Placed This Encounter  Procedures  . DG Cervical Spine Complete    Standing Status:   Future    Number of Occurrences:   1    Standing Expiration Date:   05/17/2018    Order Specific Question:   Reason for Exam (SYMPTOM  OR DIAGNOSIS REQUIRED)    Answer:   neck pain      Order Specific Question:   Is patient pregnant?    Answer:   No    Order Specific Question:   Preferred imaging location?    Answer:   Internal    Order Specific Question:   Radiology Contrast Protocol - do NOT remove file path    Answer:   [email or web address]    Meds ordered this encounter  Medications  . predniSONE (DELTASONE) 20 MG tablet    Sig: Take 2 tablets (40 mg total) by mouth daily with breakfast.    Dispense:  10 tablet    Refill:  0    Laroy Apple, MD [facility] 03/17/2017, 6:57 PM

## 2017-03-31 ENCOUNTER — Ambulatory Visit (INDEPENDENT_AMBULATORY_CARE_PROVIDER_SITE_OTHER): Payer: 59 | Admitting: Family

## 2017-03-31 ENCOUNTER — Encounter: Payer: Self-pay | Admitting: Family

## 2017-03-31 VITALS — BP 133/87 | HR 102 | Temp 99.2°F | Ht 70.0 in | Wt 236.4 lb

## 2017-03-31 DIAGNOSIS — R300 Dysuria: Secondary | ICD-10-CM

## 2017-03-31 DIAGNOSIS — N3000 Acute cystitis without hematuria: Secondary | ICD-10-CM

## 2017-03-31 LAB — URINALYSIS
Bilirubin, UA: NEGATIVE
Glucose, UA: NEGATIVE
Ketones, UA: NEGATIVE
NITRITE UA: NEGATIVE
Protein, UA: NEGATIVE
RBC, UA: NEGATIVE
Specific Gravity, UA: 1.025 (ref 1.005–1.030)
UUROB: 0.2 mg/dL (ref 0.2–1.0)
pH, UA: 5.5 (ref 5.0–7.5)

## 2017-03-31 MED ORDER — SULFAMETHOXAZOLE-TRIMETHOPRIM 800-160 MG PO TABS: 1.0000 | ORAL_TABLET | Freq: Two times a day (BID) | ORAL | 0 refills | Status: DC

## 2017-03-31 NOTE — Progress Notes (Signed)
   Subjective:    Patient ID: Heidi Gibbs, female    DOB: [birth year], 26 y.o.   MRN: [id number]  Urinary Frequency   This is a new problem. The current episode started yesterday. The problem occurs every urination. The problem has been gradually worsening. The pain is at a severity of 0/10. The patient is experiencing no pain. Associated symptoms include flank pain, frequency, hesitancy, nausea and urgency. Pertinent negatives include no hematuria or vomiting. She has tried increased fluids for the symptoms. The treatment provided mild relief.  Abdominal Pain  Associated symptoms include frequency and nausea. Pertinent negatives include no hematuria or vomiting.      Review of Systems  Gastrointestinal: Positive for abdominal pain and nausea. Negative for vomiting.  Genitourinary: Positive for flank pain, frequency, hesitancy and urgency. Negative for hematuria.  All other systems reviewed and are negative.      Objective:   Physical Exam  Constitutional: She is oriented to person, place, and time. She appears well-developed and well-nourished. No distress.  HENT:  Head: Normocephalic.  Eyes: Pupils are equal, round, and reactive to light.  Neck: Normal range of motion. Neck supple. No thyromegaly present.  Cardiovascular: Normal rate, regular rhythm, normal heart sounds and intact distal pulses.   No murmur heard. Pulmonary/Chest: Effort normal and breath sounds normal. No respiratory distress. She has no wheezes.  Abdominal: Soft. Bowel sounds are normal. She exhibits no distension. There is tenderness (mild lower abd tenderness).  Musculoskeletal: Normal range of motion. She exhibits no edema or tenderness.  Neurological: She is alert and oriented to person, place, and time.  Skin: Skin is warm and dry.  Psychiatric: She has a normal mood and affect. Her behavior is normal. Judgment and thought content normal.  Vitals reviewed.   BP 133/87   Pulse (!) 102   Temp 99.2  F (37.3 C) (Oral)   Ht 5\' 10"  (1.778 m)   Wt 236 lb 6.4 oz (107.2 kg)   BMI 33.92 kg/m       Assessment & Plan:  1. Dysuria - Urine Culture - Urinalysis - sulfamethoxazole-trimethoprim (BACTRIM DS) 800-160 MG tablet; Take 1 tablet by mouth 2 (two) times daily.  Dispense: 14 tablet; Refill: 0  2. Acute cystitis without hematuria Force fluids AZO over the counter X2 days RTO prn Culture pending - sulfamethoxazole-trimethoprim (BACTRIM DS) 800-160 MG tablet; Take 1 tablet by mouth 2 (two) times daily.  Dispense: 14 tablet; Refill: 0   Evelina Dun, FNP

## 2017-03-31 NOTE — Patient Instructions (Signed)

## 2017-04-02 LAB — URINE CULTURE

## 2017-04-26 ENCOUNTER — Other Ambulatory Visit: Payer: Self-pay | Admitting: Cardiovascular Disease

## 2017-05-12 ENCOUNTER — Other Ambulatory Visit: Payer: Self-pay | Admitting: Family Medicine

## 2017-06-03 ENCOUNTER — Other Ambulatory Visit: Payer: Self-pay | Admitting: Family Medicine

## 2017-06-03 ENCOUNTER — Other Ambulatory Visit: Payer: Self-pay | Admitting: Physician Assistant

## 2017-06-03 NOTE — Telephone Encounter (Signed)
Last seen 03/31/17  Dr Wendi Snipes

## 2017-06-10 ENCOUNTER — Other Ambulatory Visit: Payer: Self-pay | Admitting: Family Medicine

## 2017-11-03 IMAGING — MR MR HEAD WO/W CM
7 of 12 series · 29 of 48 positions shown · IV contrast (multihance)
Comparison: 12/29/2012 CT head.

CLINICAL DATA: 25 y/o  F; papilledema.

EXAM:
MRI HEAD AND ORBITS WITHOUT AND WITH CONTRAST
TECHNIQUE: Multiplanar, multiecho pulse sequences of the brain and surrounding
structures were obtained without and with intravenous contrast.
Multiplanar, multiecho pulse sequences of the orbits and surrounding
structures were obtained including fat saturation techniques, before
and after intravenous contrast administration.
CONTRAST:  20mL MULTIHANCE GADOBENATE DIMEGLUMINE 529 MG/ML IV SOLN

[Series 1: DWI · axial · 3.0mm · 0.73mm/px · z∈[-93,+68]mm · 5 of 55 slices shown (1 of 4)]
[im 1/55]
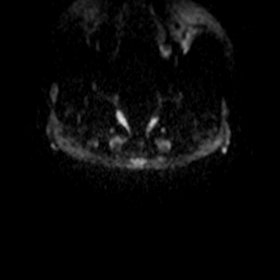
[im 14/55]
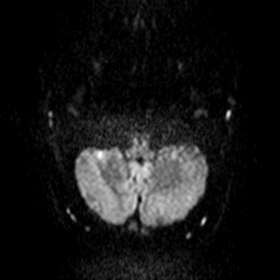
[im 28/55]
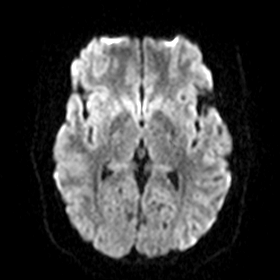
[im 41/55]
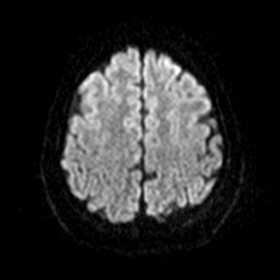
[im 55/55]
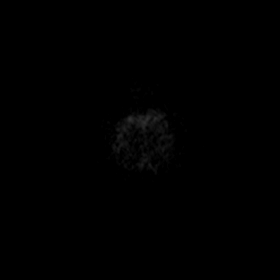

[Series 2: DWI · axial · 3.0mm · 0.73mm/px · z∈[-93,+68]mm · 5 of 55 slices shown (2 of 4)]
[im 1/55]
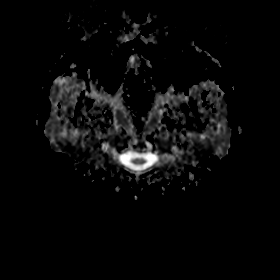
[im 14/55]
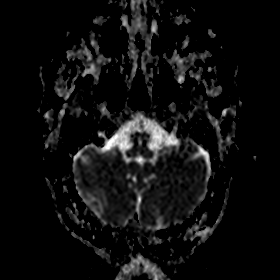
[im 28/55]
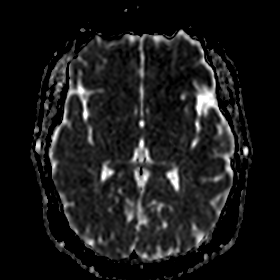
[im 41/55]
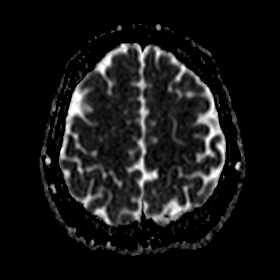
[im 55/55]
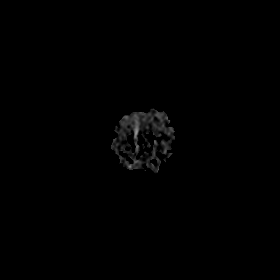

[Series 3: DWI · coronal · 5.0mm · 0.49mm/px · 4 of 38 slices shown (3 of 4)]
[im 1/38]
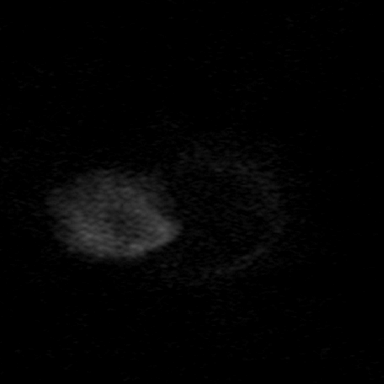
[im 13/38]
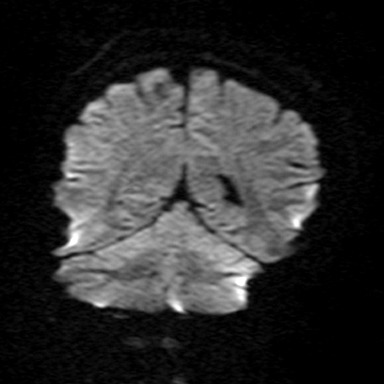
[im 25/38]
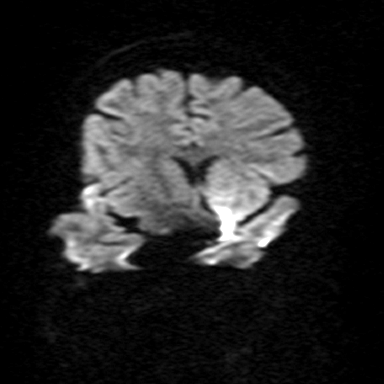
[im 38/38]
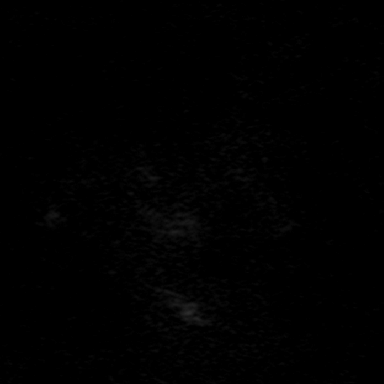

[Series 4: DWI · coronal · 5.0mm · 0.49mm/px · 3 of 37 slices shown (4 of 4)]
[im 1/37]
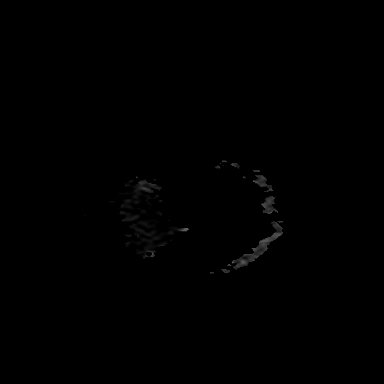
[im 19/37]
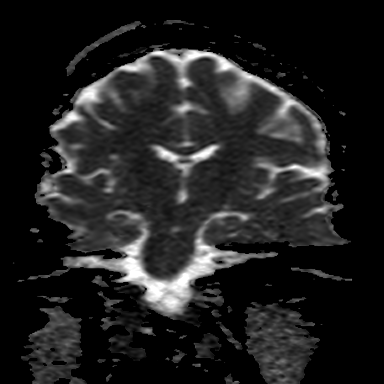
[im 37/37]
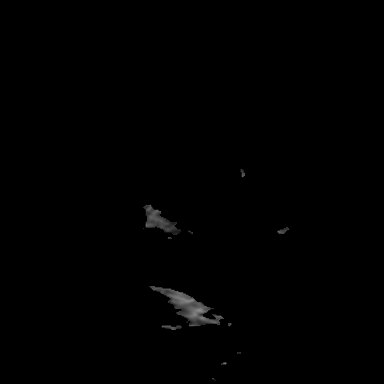

[Series 7: FLAIR · axial · 5.0mm · 0.33mm/px · z∈[-84,+59]mm · 2 of 23 slices shown]
[im 1/23]
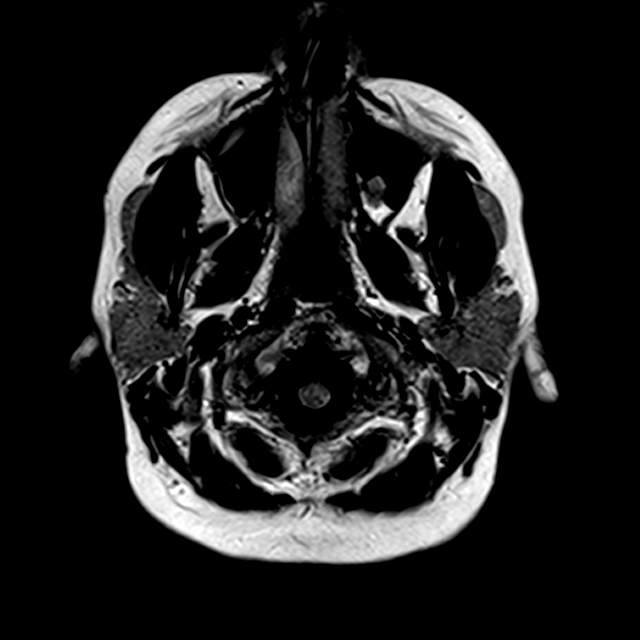
[im 23/23]
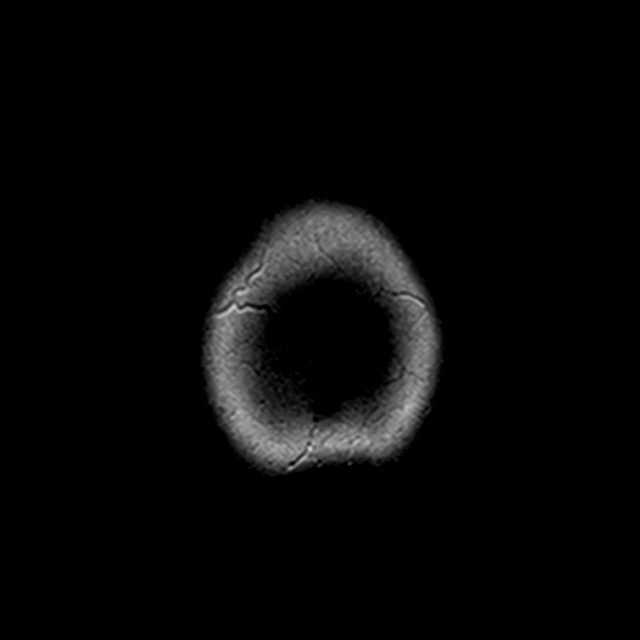

[Series 11: T1 post-contrast · axial · 2.0mm · 0.42mm/px · z∈[-115,+91]mm · 8 of 104 slices shown (1 of 2)]
[im 1/104]
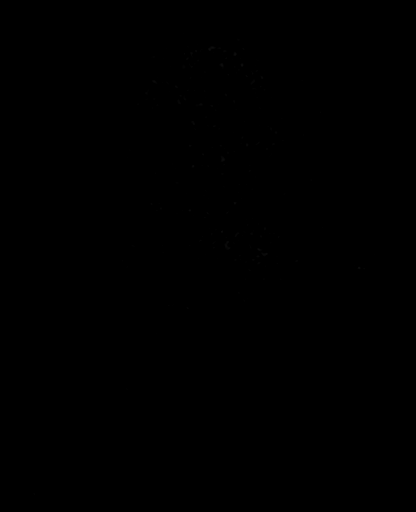
[im 12/104]
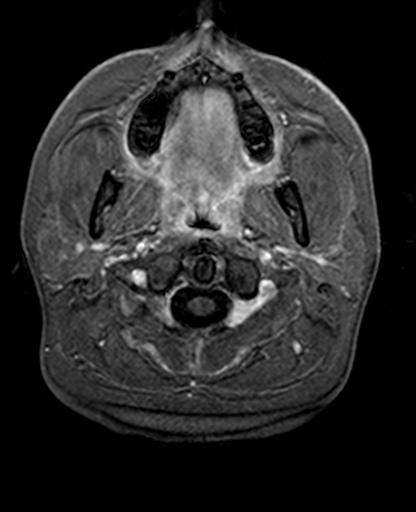
[im 35/104]
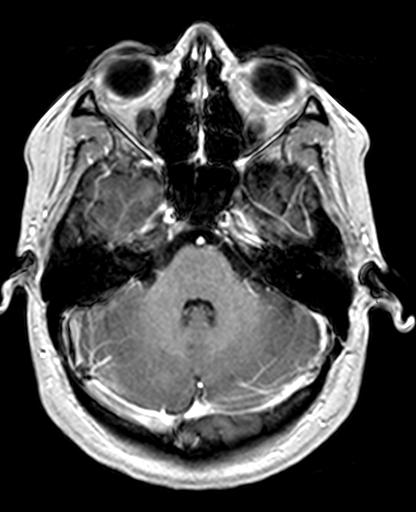
[im 46/104]
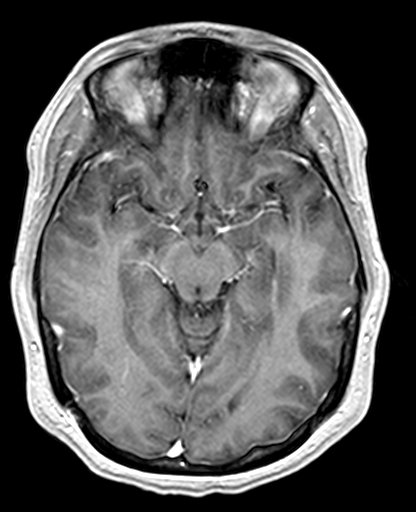
[im 58/104]
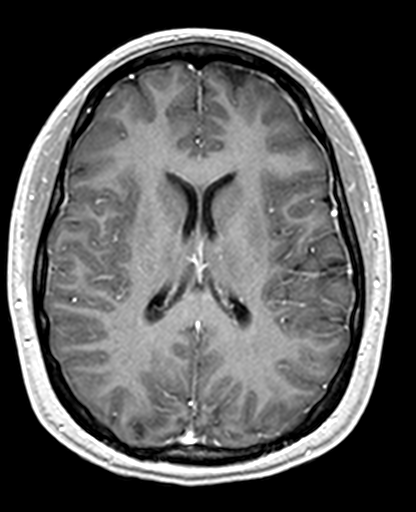
[im 69/104]
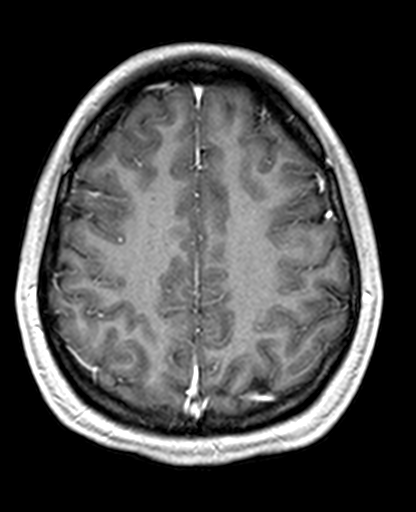
[im 92/104]
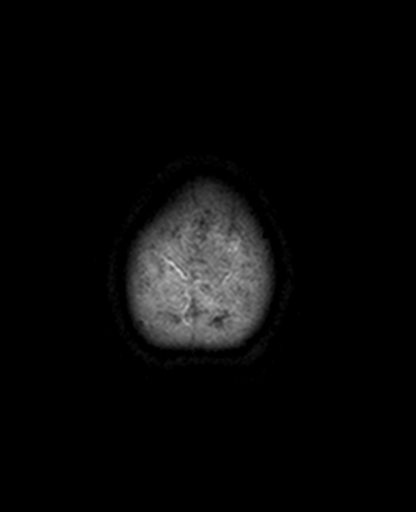
[im 104/104]
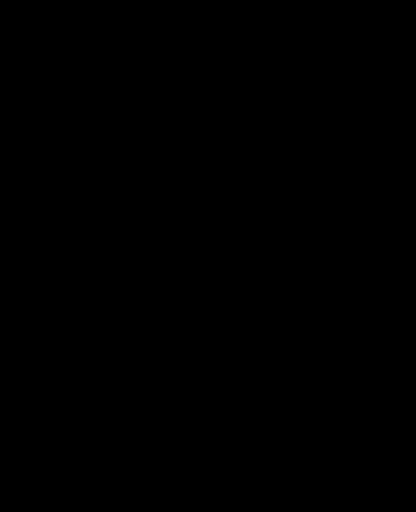

[Series 12: T1 post-contrast · coronal · 5.0mm · 0.36mm/px · 2 of 28 slices shown (2 of 2)]
[im 1/28]
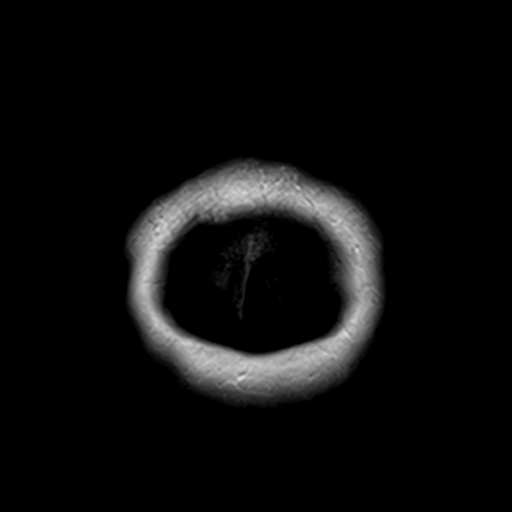
[im 14/28]
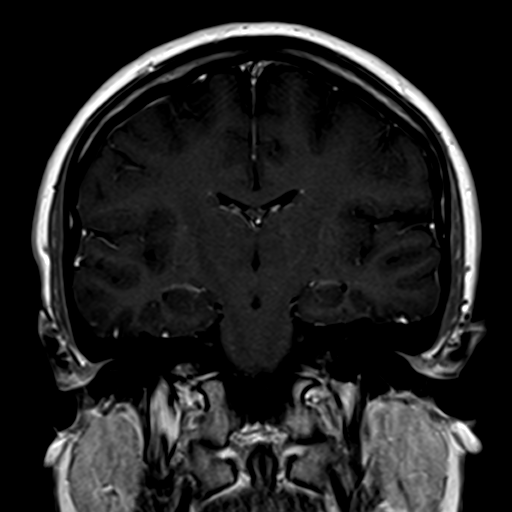

[29 of 48 positions shown; findings below may reference images not displayed]

FINDINGS: MRI HEAD FINDINGS

Brain: No acute infarction, hemorrhage, hydrocephalus, abnormal
enhancement, extra-axial collection or mass lesion of the brain.

Vascular: Normal flow voids.

Skull and upper cervical spine: Normal marrow signal.

Other: None.

MRI ORBITS FINDINGS

Orbits: Slight elevation of the optic nerve heads and flattening of
posterior globes bilaterally. Mild optic nerve sheath ectasia within
the orbits.

No abnormal T2 signal or enhancement of the optic nerves, chiasm, or
radiations. No intraorbital inflammatory process or mass is
identified. Extraocular muscles are normal in size. No proptosis.

Visualized sinuses: Mild maxillary mucosal thickening and small left
maxillary mucous retention cyst.

Soft tissues: Negative.

Limited intracranial: No significant or unexpected finding.
IMPRESSION: 1. Papilledema and mild optic nerve sheath ectasia are findings that
can be seen with idiopathic intracranial hypertension. No orbital
mass or inflammatory process identified. No abnormal signal or
enhancement of the optic nerve complexes.
2. Normal MRI of the brain without abnormal enhancement.

By: Rico Jakob Orehov M.D.

## 2017-11-08 IMAGING — DX DG CHEST 2V
2 series · 2 of 2 positions shown · non-contrast
Comparison: Chest x-ray dated 10/21/2015.

CLINICAL DATA: Left-sided chest pain radiating to back. History of
hypertension. Some shortness of breath today.

EXAM:
CHEST  2 VIEW

[chest pa]
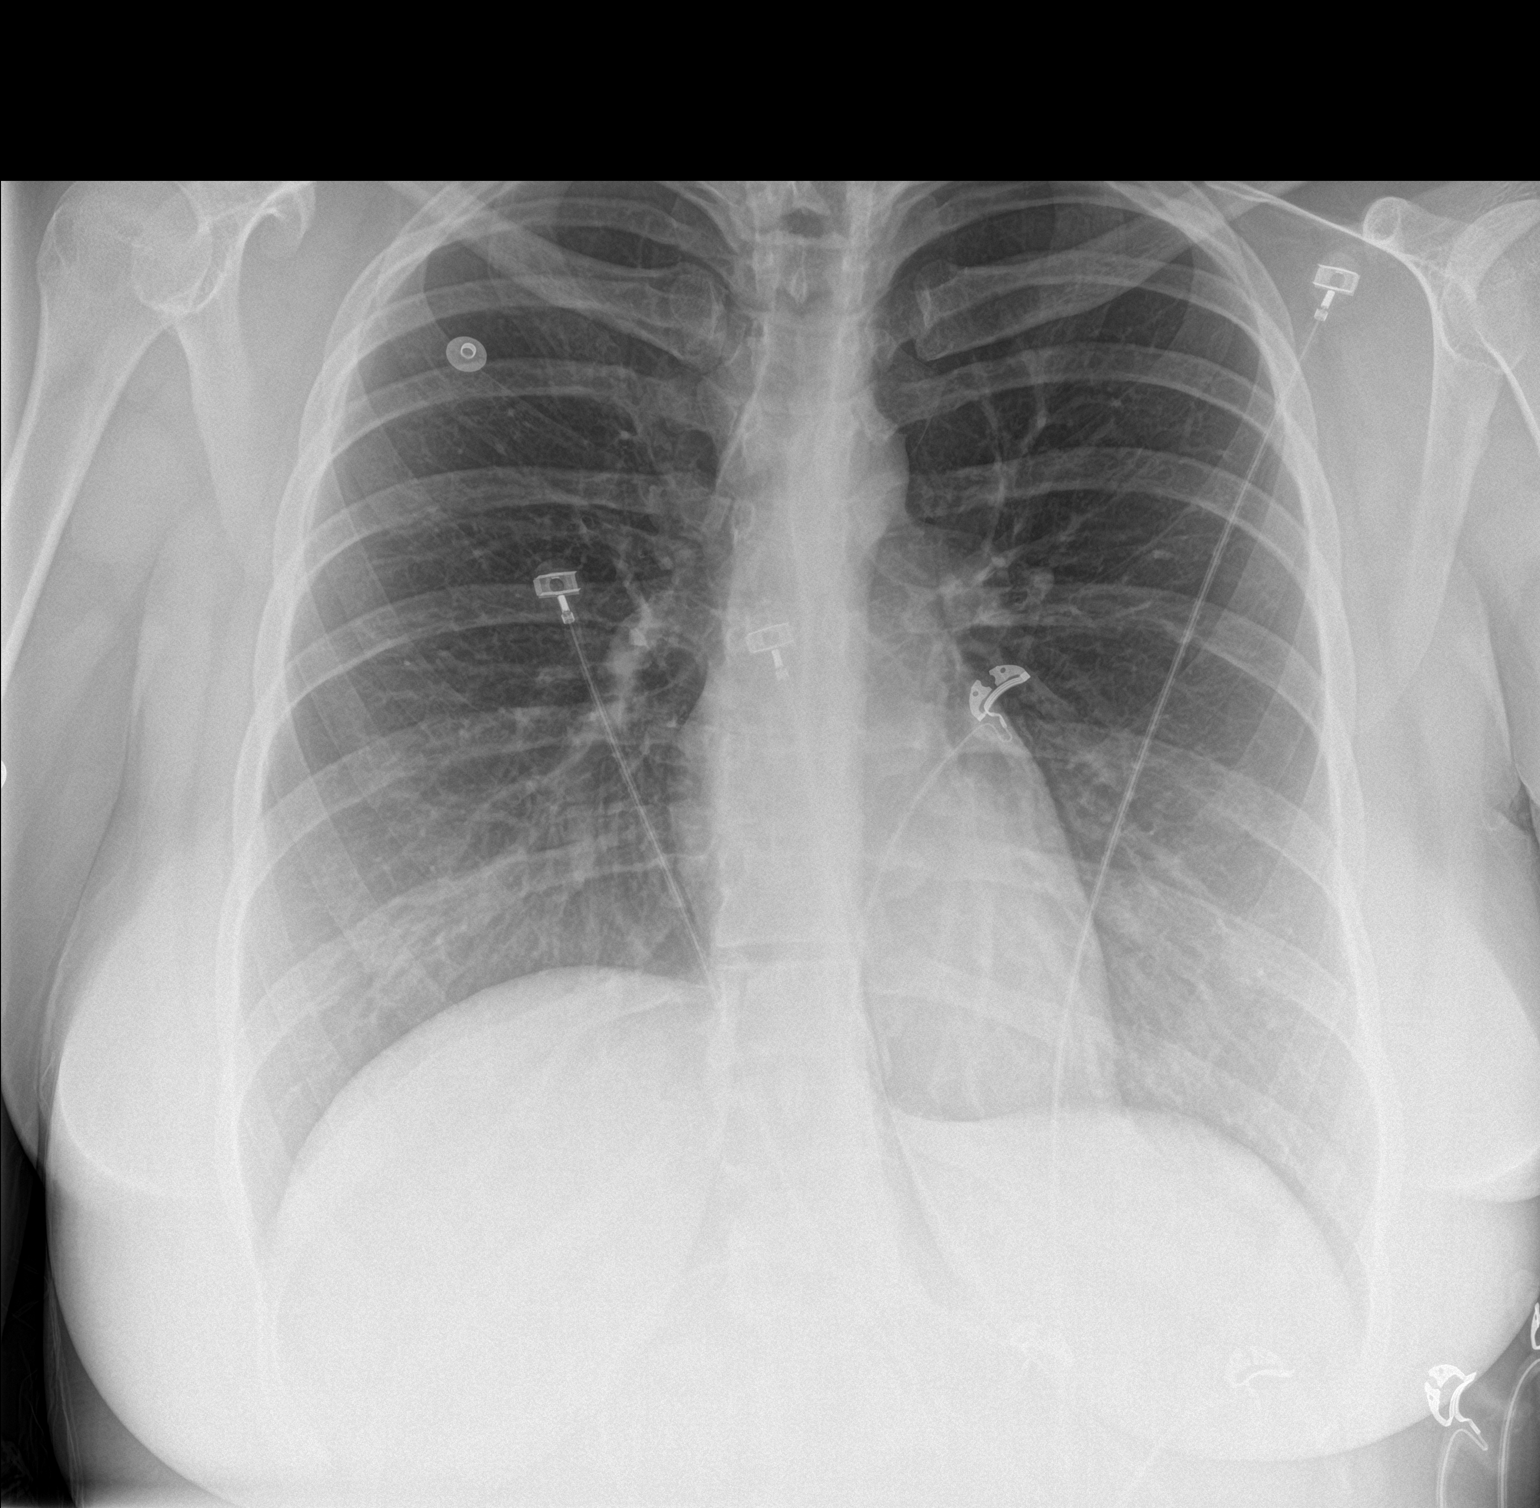

[chest lat]
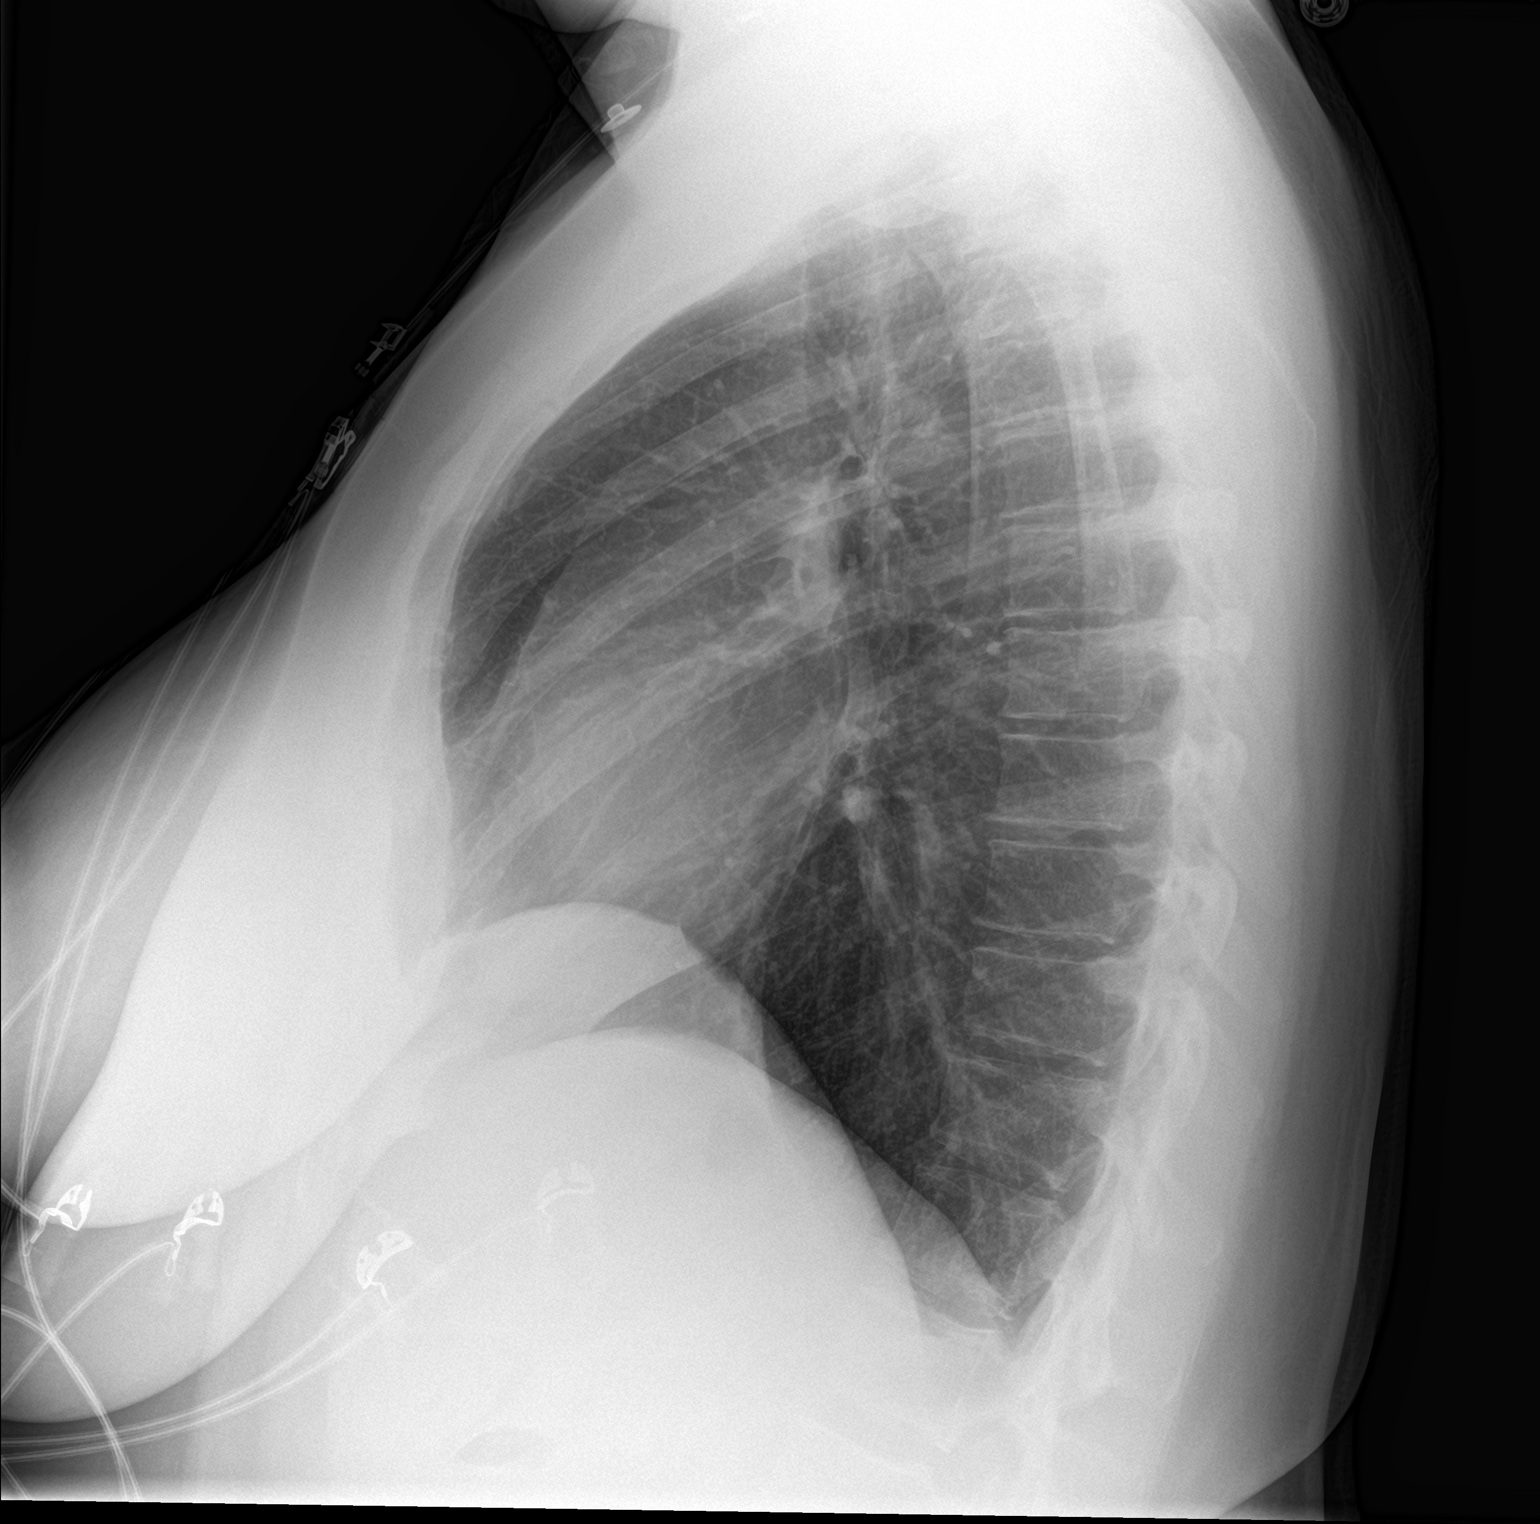

[2 of 2 positions shown; findings below may reference images not displayed]

FINDINGS: Cardiomediastinal silhouette is normal in size and configuration.
Lungs are clear. Lung volumes are normal. No evidence of pneumonia.
No pleural effusion. No pneumothorax.

Stable mild dextroscoliosis of the thoracic spine. No acute or
suspicious osseous finding.
IMPRESSION: No evidence of acute cardiopulmonary abnormality. No evidence of
pneumonia or pulmonary edema.

## 2017-11-18 DIAGNOSIS — G932 Benign intracranial hypertension: Secondary | ICD-10-CM | POA: Diagnosis not present

## 2018-01-16 DIAGNOSIS — J029 Acute pharyngitis, unspecified: Secondary | ICD-10-CM | POA: Diagnosis not present

## 2018-04-06 ENCOUNTER — Other Ambulatory Visit: Payer: Self-pay | Admitting: Physician Assistant

## 2018-04-21 ENCOUNTER — Ambulatory Visit (INDEPENDENT_AMBULATORY_CARE_PROVIDER_SITE_OTHER): Payer: 59 | Admitting: Family Medicine

## 2018-04-21 VITALS — BP 126/72 | HR 90 | Temp 99.3°F | Ht 70.0 in | Wt 233.0 lb

## 2018-04-21 DIAGNOSIS — Z6835 Body mass index (BMI) 35.0-35.9, adult: Secondary | ICD-10-CM | POA: Insufficient documentation

## 2018-04-21 DIAGNOSIS — G932 Benign intracranial hypertension: Secondary | ICD-10-CM

## 2018-04-21 DIAGNOSIS — I10 Essential (primary) hypertension: Secondary | ICD-10-CM

## 2018-04-21 DIAGNOSIS — Z0000 Encounter for general adult medical examination without abnormal findings: Secondary | ICD-10-CM | POA: Diagnosis not present

## 2018-04-21 DIAGNOSIS — Z6833 Body mass index (BMI) 33.0-33.9, adult: Secondary | ICD-10-CM

## 2018-04-21 DIAGNOSIS — G479 Sleep disorder, unspecified: Secondary | ICD-10-CM

## 2018-04-21 DIAGNOSIS — Z111 Encounter for screening for respiratory tuberculosis: Secondary | ICD-10-CM

## 2018-04-21 MED ORDER — METOPROLOL SUCCINATE ER 25 MG PO TB24: 25.0000 mg | ORAL_TABLET | Freq: Every day | ORAL | 3 refills | Status: DC

## 2018-04-21 MED ORDER — TRAZODONE HCL 100 MG PO TABS: 50.0000 mg | ORAL_TABLET | Freq: Every evening | ORAL | 3 refills | Status: DC | PRN

## 2018-04-21 NOTE — Patient Instructions (Signed)

## 2018-04-21 NOTE — Progress Notes (Signed)
Subjective:    Patient ID: Heidi Gibbs, female    DOB: 1990/11/10, 27 y.o.   MRN: [id number]  Chief Complaint:  Annual Exam   HPI: Heidi Gibbs is a 27 y.o. female presenting on 04/21/2018 for Annual Exam  Pt presents today for her annual physical exam. Pt states she has been doing very well overall. States she has been more active and drinking more water. She has lost 3lbs since last visit at [facility]. Pt states she is compliant with all of her medications and is tolerating them well. States she does require the trazodone at night for sleep. States she only takes 50 mg, but it does help with her insomnia. Denies headaches, chest pain, leg swelling, dizziness, visual changes, palpitations, anxiety, or depression.   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 Pt has her PAPs and breast exams at GYN office.  Relevant past medical, surgical, family, and social history reviewed and updated as indicated.  Allergies and medications reviewed and updated.   Past Medical History:  Diagnosis Date  . Brain tumor (benign) ([place])   . DDD (degenerative disc disease), lumbar   . Generalized anxiety disorder   . Insomnia   . Migraines     Past Surgical History:  Procedure Laterality Date  . LUMBAR DISC SURGERY  06/21/2016  . WISDOM TOOTH EXTRACTION      Social History   Socioeconomic History  . Marital status: Single    Spouse name: Not on file  . Number of children: Not on file  . Years of education: Not on file  . Highest education level: Not on file  Occupational History  . Not on file  Social Needs  . Financial resource strain: Not on file  . Food insecurity:    Worry: Not on file    Inability: Not on file  . Transportation needs:    Medical: Not on file    Non-medical: Not on file  Tobacco Use  . Smoking status: Never Smoker  . Smokeless tobacco: Never Used  Substance and Sexual Activity  . Alcohol use: No  . Drug use: No  . Sexual activity: Not on file    Lifestyle  . Physical activity:    Days per week: Not on file    Minutes per session: Not on file  . Stress: Not on file  Relationships  . Social connections:    Talks on phone: Not on file    Gets together: Not on file    Attends religious service: Not on file    Active member of club or organization: Not on file    Attends meetings of clubs or organizations: Not on file    Relationship status: Not on file  . Intimate partner violence:    Fear of current or ex partner: Not on file    Emotionally abused: Not on file    Physically abused: Not on file    Forced sexual activity: Not on file  Other Topics Concern  . Not on file  Social History Narrative  . Not on file    Outpatient Encounter Medications as of 04/21/2018  Medication Sig  . acetaZOLAMIDE (DIAMOX) 500 MG capsule Take 500 mg by mouth 2 (two) times daily.  Thurston Pounds 0.15-0.03 MG tablet Take 1 tablet by mouth daily.  . metoprolol succinate (TOPROL-XL) 25 MG 24 hr tablet Take 1 tablet (25 mg total) by mouth daily.  Marland Kitchen omeprazole (PRILOSEC) 20 MG capsule Take 20 mg  by mouth daily as needed (for indigestion).   . traZODone (DESYREL) 100 MG tablet Take 0.5 tablets (50 mg total) by mouth at bedtime as needed for sleep.  . [DISCONTINUED] metoprolol succinate (TOPROL-XL) 25 MG 24 hr tablet TAKE 1 TABLET DAILY  . [DISCONTINUED] traZODone (DESYREL) 100 MG tablet TAKE ONE-HALF (1/2) TO 1 TABLET AT BEDTIME  . Ascorbic Acid (VITAMIN C CR) 500 MG CPCR Vitamin C 500 mg capsule,extended release  Take by oral route.  . [DISCONTINUED] sertraline (ZOLOFT) 50 MG tablet TAKE 1 TABLET DAILY  . [DISCONTINUED] sulfamethoxazole-trimethoprim (BACTRIM DS) 800-160 MG tablet Take 1 tablet by mouth 2 (two) times daily.   No facility-administered encounter medications on file as of 04/21/2018.     Allergies  Allergen Reactions  . Erythromycin Hives  . Penicillins Nausea And Vomiting    Has patient had a PCN reaction causing immediate rash,  facial/tongue/throat swelling, SOB or lightheadedness with hypotension: No Has patient had a PCN reaction causing severe rash involving mucus membranes or skin necrosis: No Has patient had a PCN reaction that required hospitalization No Has patient had a PCN reaction occurring within the last 10 years: Yes If all of the above answers are "NO", then may proceed with Cephalosporin use.   . Tamiflu [Oseltamivir Phosphate] Nausea And Vomiting  . Clindamycin Hives  . Latex   . Oseltamivir Nausea And Vomiting    Review of Systems  Constitutional: Negative for activity change, appetite change, chills, fatigue and fever.  HENT: Negative for congestion.   Eyes: Negative for photophobia and visual disturbance.  Respiratory: Negative for cough, chest tightness and shortness of breath.   Cardiovascular: Negative for chest pain, palpitations and leg swelling.  Gastrointestinal: Negative for abdominal pain, constipation, diarrhea, nausea and vomiting.  Endocrine: Negative for cold intolerance, heat intolerance, polydipsia, polyphagia and polyuria.  Genitourinary: Negative for menstrual problem.  Musculoskeletal: Negative for arthralgias.  Neurological: Negative for dizziness, tremors, seizures, syncope, weakness, light-headedness, numbness and headaches.  Psychiatric/Behavioral: Negative for confusion. The patient is not nervous/anxious.   All other systems reviewed and are negative.       Objective:    BP 126/72   Pulse 90   Temp 99.3 F (37.4 C) (Oral)   Ht '5\' 10"'$  (1.778 m)   Wt 233 lb (105.7 kg)   BMI 33.43 kg/m    Wt Readings from Last 3 Encounters:  04/21/18 233 lb (105.7 kg)  03/31/17 236 lb 6.4 oz (107.2 kg)  03/17/17 235 lb (106.6 kg)    Physical Exam  Constitutional: She is oriented to person, place, and time. She appears well-developed and well-nourished. She is cooperative. No distress.  HENT:  Head: Normocephalic and atraumatic.  Right Ear: Hearing, tympanic membrane,  external ear and ear canal normal.  Left Ear: Hearing, tympanic membrane, external ear and ear canal normal.  Mouth/Throat: Uvula is midline, oropharynx is clear and moist and mucous membranes are normal.  Eyes: Pupils are equal, round, and reactive to light. Conjunctivae, EOM and lids are normal.  Neck: Trachea normal, normal range of motion, full passive range of motion without pain and phonation normal. Neck supple. Normal carotid pulses and no JVD present. No tracheal tenderness present. Carotid bruit is not present. No tracheal deviation present. No thyroid mass and no thyromegaly present.  Cardiovascular: Normal rate, regular rhythm, S1 normal, S2 normal, normal heart sounds and intact distal pulses. PMI is not displaced. Exam reveals no gallop and no friction rub.  No murmur heard. Pulmonary/Chest: Effort normal  and breath sounds normal. No respiratory distress. She has no wheezes. She has no rhonchi. She has no rales.  Abdominal: Soft. Normal appearance, normal aorta and bowel sounds are normal. There is no hepatosplenomegaly. There is no tenderness.  Musculoskeletal: Normal range of motion.  Lymphadenopathy:    She has no cervical adenopathy.  Neurological: She is alert and oriented to person, place, and time. She has normal strength and normal reflexes. No cranial nerve deficit or sensory deficit. She displays a negative Romberg sign. GCS eye subscore is 4. GCS verbal subscore is 5. GCS motor subscore is 6.  Skin: Skin is warm and dry. Capillary refill takes less than 2 seconds.  Psychiatric: She has a normal mood and affect. Her speech is normal and behavior is normal. Judgment and thought content normal. Cognition and memory are normal.  Nursing note and vitals reviewed.   Results for orders placed or performed in visit on 03/31/17  Urine Culture  Result Value Ref Range   Urine Culture, Routine Final report    Organism ID, Bacteria Comment   Urinalysis  Result Value Ref Range    Specific Gravity, UA 1.025 1.005 - 1.030   pH, UA 5.5 5.0 - 7.5   Color, UA Yellow Yellow   Appearance Ur Clear Clear   Leukocytes, UA Trace (A) Negative   Protein, UA Negative Negative/Trace   Glucose, UA Negative Negative   Ketones, UA Negative Negative   RBC, UA Negative Negative   Bilirubin, UA Negative Negative   Urobilinogen, Ur 0.2 0.2 - 1.0 mg/dL   Nitrite, UA Negative Negative       Pertinent labs & imaging results that were available during my care of the patient were reviewed by me and considered in my medical decision making.  Assessment & Plan:  Heidi Gibbs was seen today for annual exam.  Diagnoses and all orders for this visit:  Annual physical exam -     CBC with Differential/Platelet -     CMP14+EGFR -     Lipid panel -     TSH -     Microalbumin / creatinine urine ratio -     HIV Antibody (routine testing w rflx)  Essential hypertension DASH diet, weight loss. Increase exercise. My Fitness Pal App discussed. Medications as prescribed.  -     metoprolol succinate (TOPROL-XL) 25 MG 24 hr tablet; Take 1 tablet (25 mg total) by mouth daily. -     CBC with Differential/Platelet -     CMP14+EGFR -     Lipid panel -     TSH -     Microalbumin / creatinine urine ratio  Sleeping difficulty Sleep hygiene discussed. Limit caffeine intake prior to bed. Medications as prescribed.  -     traZODone (DESYREL) 100 MG tablet; Take 0.5 tablets (50 mg total) by mouth at bedtime as needed for sleep.  BMI 33.0-33.9,adult Increase exercise. Increase water intake. Limit fried greasy foods and sugary foods and drinks. My Fitness Pal App discussed.  -     CMP14+EGFR -     Lipid panel -     TSH  Screening for tuberculosis -     QuantiFERON-TB Gold Plus  Diet and exercise encouraged.  Increase water intake. DASH diet. Health maintenance discussed  Continue all other maintenance medications.  Follow up plan: Return in about 6 months (around 10/21/2018), or if symptoms  worsen or fail to improve.  Educational handout given for health maintenance   The above assessment and  management plan was discussed with the patient. The patient verbalized understanding of and has agreed to the management plan. Patient is aware to call the clinic if symptoms persist or worsen. Patient is aware when to return to the clinic for a follow-up visit. Patient educated on when it is appropriate to go to the emergency department.   Michelle Cameshia Cressman, FNP-C [facility] [phone]  

## 2018-04-22 LAB — MICROALBUMIN / CREATININE URINE RATIO
CREATININE, UR: 381.4 mg/dL
MICROALBUM., U, RANDOM: 28.3 ug/mL
Microalb/Creat Ratio: 7.4 mg/g creat (ref 0.0–30.0)

## 2018-04-24 ENCOUNTER — Telehealth: Payer: Self-pay | Admitting: Family Medicine

## 2018-04-24 LAB — QUANTIFERON-TB GOLD PLUS
QUANTIFERON NIL VALUE: 0.02 [IU]/mL
QUANTIFERON TB2 AG VALUE: 0.04 [IU]/mL
QUANTIFERON-TB GOLD PLUS: NEGATIVE
QuantiFERON Mitogen Value: >10 IU/mL
QuantiFERON TB1 Ag Value: 0.02 IU/mL

## 2018-04-24 LAB — CBC WITH DIFFERENTIAL/PLATELET
Basophils Absolute: 0.1 10*3/uL (ref 0.0–0.2)
Basos: 1 %
EOS (ABSOLUTE): 0.1 10*3/uL (ref 0.0–0.4)
EOS: 1 %
HEMATOCRIT: 42.8 % (ref 34.0–46.6)
HEMOGLOBIN: 14.2 g/dL (ref 11.1–15.9)
Immature Grans (Abs): 0 10*3/uL (ref 0.0–0.1)
Immature Granulocytes: 0 %
LYMPHS ABS: 3.5 10*3/uL — AB (ref 0.7–3.1)
LYMPHS: 35 %
MCH: 28.3 pg (ref 26.6–33.0)
MCHC: 33.2 g/dL (ref 31.5–35.7)
MCV: 85 fL (ref 79–97)
Monocytes Absolute: 0.7 10*3/uL (ref 0.1–0.9)
Monocytes: 7 %
Neutrophils Absolute: 5.6 10*3/uL (ref 1.4–7.0)
Neutrophils: 56 %
Platelets: 393 10*3/uL (ref 150–450)
RBC: 5.02 x10E6/uL (ref 3.77–5.28)
RDW: 13.1 % (ref 12.3–15.4)
WBC: 10 10*3/uL (ref 3.4–10.8)

## 2018-04-24 LAB — LIPID PANEL
CHOL/HDL RATIO: 5.8 ratio — AB (ref 0.0–4.4)
CHOLESTEROL TOTAL: 245 mg/dL — AB (ref 100–199)
HDL: 42 mg/dL (ref >39)
LDL CALC: 172 mg/dL — AB (ref 0–99)
Triglycerides: 157 mg/dL — ABNORMAL HIGH (ref 0–149)
VLDL Cholesterol Cal: 31 mg/dL (ref 5–40)

## 2018-04-24 LAB — CMP14+EGFR
ALBUMIN: 4.1 g/dL (ref 3.5–5.5)
ALK PHOS: 73 IU/L (ref 39–117)
ALT: 13 IU/L (ref 0–32)
AST: 13 IU/L (ref 0–40)
Albumin/Globulin Ratio: 1.4 (ref 1.2–2.2)
BUN / CREAT RATIO: 9 (ref 9–23)
BUN: 7 mg/dL (ref 6–20)
Bilirubin Total: <0.2 mg/dL (ref 0.0–1.2)
CO2: 19 mmol/L — AB (ref 20–29)
CREATININE: 0.77 mg/dL (ref 0.57–1.00)
Calcium: 9.1 mg/dL (ref 8.7–10.2)
Chloride: 106 mmol/L (ref 96–106)
GFR, EST AFRICAN AMERICAN: 125 mL/min/{1.73_m2} (ref >59)
GFR, EST NON AFRICAN AMERICAN: 108 mL/min/{1.73_m2} (ref >59)
GLOBULIN, TOTAL: 2.9 g/dL (ref 1.5–4.5)
Glucose: 75 mg/dL (ref 65–99)
Potassium: 3.6 mmol/L (ref 3.5–5.2)
SODIUM: 141 mmol/L (ref 134–144)
TOTAL PROTEIN: 7 g/dL (ref 6.0–8.5)

## 2018-04-24 LAB — HIV ANTIBODY (ROUTINE TESTING W REFLEX): HIV Screen 4th Generation wRfx: NONREACTIVE

## 2018-04-24 LAB — TSH: TSH: 1.15 u[IU]/mL (ref 0.450–4.500)

## 2018-04-24 NOTE — Telephone Encounter (Signed)
Message left for pt to call back pertaining to lab results: CBC normal. CMP normal. Cholesterol abnormal: total cholesterol 245, triglycerides 157, LDL 172. Pt should make lifestyle modifications, diet and exercise. Decrease intake of fatty fried foods. Increase intake of fruits, vegetables, and lean proteins such as salmon. Use olive oil or coconut oil rather than vegetable oils. Pt should start Red Yeast Rice, 2400 mg daily. Pt to return in 3 months to recheck lipid panel.

## 2018-04-25 NOTE — Telephone Encounter (Signed)
Patient aware.

## 2018-04-28 DIAGNOSIS — R197 Diarrhea, unspecified: Secondary | ICD-10-CM | POA: Diagnosis not present

## 2018-05-26 ENCOUNTER — Ambulatory Visit: Payer: BLUE CROSS/BLUE SHIELD | Admitting: Family

## 2018-05-26 ENCOUNTER — Encounter: Payer: Self-pay | Admitting: Family

## 2018-05-26 VITALS — BP 133/85 | HR 97 | Temp 98.8°F | Ht 70.0 in | Wt 229.2 lb

## 2018-05-26 DIAGNOSIS — J069 Acute upper respiratory infection, unspecified: Secondary | ICD-10-CM

## 2018-05-26 MED ORDER — BENZONATATE 200 MG PO CAPS: 200.0000 mg | ORAL_CAPSULE | Freq: Three times a day (TID) | ORAL | 1 refills | Status: DC | PRN

## 2018-05-26 MED ORDER — FLUTICASONE PROPIONATE 50 MCG/ACT NA SUSP: 2.0000 | Freq: Every day | NASAL | 6 refills | Status: DC

## 2018-05-26 NOTE — Patient Instructions (Signed)
Upper Respiratory Infection, Adult Most upper respiratory infections (URIs) are caused by a virus. A URI affects the nose, throat, and upper air passages. The most common type of URI is often called "the common cold." Follow these instructions at home:  Take medicines only as told by your doctor.  Gargle warm saltwater or take cough drops to comfort your throat as told by your doctor.  Use a warm mist humidifier or inhale steam from a shower to increase air moisture. This may make it easier to breathe.  Drink enough fluid to keep your pee (urine) clear or pale yellow.  Eat soups and other clear broths.  Have a healthy diet.  Rest as needed.  Go back to work when your fever is gone or your doctor says it is okay. ? You may need to stay home longer to avoid giving your URI to others. ? You can also wear a face mask and wash your hands often to prevent spread of the virus.  Use your inhaler more if you have asthma.  Do not use any tobacco products, including cigarettes, chewing tobacco, or electronic cigarettes. If you need help quitting, ask your doctor. Contact a doctor if:  You are getting worse, not better.  Your symptoms are not helped by medicine.  You have chills.  You are getting more short of breath.  You have brown or red mucus.  You have yellow or brown discharge from your nose.  You have pain in your face, especially when you bend forward.  You have a fever.  You have puffy (swollen) neck glands.  You have pain while swallowing.  You have white areas in the back of your throat. Get help right away if:  You have very bad or constant: ? Headache. ? Ear pain. ? Pain in your forehead, behind your eyes, and over your cheekbones (sinus pain). ? Chest pain.  You have long-lasting (chronic) lung disease and any of the following: ? Wheezing. ? Long-lasting cough. ? Coughing up blood. ? A change in your usual mucus.  You have a stiff neck.  You have  changes in your: ? Vision. ? Hearing. ? Thinking. ? Mood. This information is not intended to replace advice given to you by your health care provider. Make sure you discuss any questions you have with your health care provider. Document Released: 12/08/2007 Document Revised: 02/22/2016 Document Reviewed: 09/26/2013 Elsevier Interactive Patient Education  2018 [employer]vier Inc.  

## 2018-05-26 NOTE — Progress Notes (Signed)
   Subjective:    Patient ID: Heidi Gibbs, female    DOB: 25-Apr-1991, 25 y.o.   MRN: [id number]  Chief Complaint  Patient presents with  . Cough  . nasal drip  . Headache    Cough  This is a new problem. The current episode started yesterday. The problem has been gradually improving. The problem occurs every few minutes. The cough is productive of sputum. Associated symptoms include ear pain, headaches, myalgias and nasal congestion. Pertinent negatives include no chills, ear congestion, fever, sore throat or wheezing. The symptoms are aggravated by lying down. She has tried rest for the symptoms. The treatment provided mild relief. There is no history of asthma.  Headache   Associated symptoms include coughing and ear pain. Pertinent negatives include no fever or sore throat.      Review of Systems  Constitutional: Negative for chills and fever.  HENT: Positive for ear pain. Negative for sore throat.   Respiratory: Positive for cough. Negative for wheezing.   Musculoskeletal: Positive for myalgias.  Neurological: Positive for headaches.  All other systems reviewed and are negative.      Objective:   Physical Exam  Constitutional: She is oriented to person, place, and time. She appears well-developed and well-nourished. No distress.  HENT:  Head: Normocephalic and atraumatic.  Right Ear: External ear normal.  Nose: Mucosal edema and rhinorrhea present.  Mouth/Throat: Posterior oropharyngeal erythema present.  Eyes: Pupils are equal, round, and reactive to light.  Neck: Normal range of motion. Neck supple. No thyromegaly present.  Cardiovascular: Normal rate, regular rhythm, normal heart sounds and intact distal pulses.  No murmur heard. Pulmonary/Chest: Effort normal and breath sounds normal. No respiratory distress. She has no wheezes.  Intermittent coarse nonproductive cough  Abdominal: Soft. Bowel sounds are normal. She exhibits no distension. There is no  tenderness.  Musculoskeletal: Normal range of motion. She exhibits no edema or tenderness.  Neurological: She is alert and oriented to person, place, and time. She has normal reflexes. No cranial nerve deficit.  Skin: Skin is warm and dry.  Psychiatric: She has a normal mood and affect. Her behavior is normal. Judgment and thought content normal.  Vitals reviewed.    BP 133/85   Pulse 97   Temp 98.8 F (37.1 C)   Ht 5\' 10"  (1.778 m)   Wt 229 lb 3.2 oz (104 kg)   BMI 32.89 kg/m       Assessment & Plan:  Heidi Gibbs comes in today with chief complaint of Cough; nasal drip; and Headache   Diagnosis and orders addressed:  1. Viral upper respiratory tract infection  Take meds as prescribed - Use a cool mist humidifier  -Use saline nose sprays frequently -Force fluids -For any cough or congestion  Use plain Mucinex- regular strength or max strength is fine -For fever or aces or pains- take tylenol or ibuprofen. -Throat lozenges if help -New toothbrush in 3 days RTO if symptoms worsen or do not improve  - fluticasone (FLONASE) 50 MCG/ACT nasal spray; Place 2 sprays into both nostrils daily.  Dispense: 16 g; Refill: 6 - benzonatate (TESSALON) 200 MG capsule; Take 1 capsule (200 mg total) by mouth 3 (three) times daily as needed.  Dispense: 30 capsule; Refill: [place], FNP

## 2018-06-21 ENCOUNTER — Encounter: Payer: Self-pay | Admitting: Family Medicine

## 2018-06-21 ENCOUNTER — Ambulatory Visit: Payer: BC Managed Care – PPO | Admitting: Family Medicine

## 2018-06-21 VITALS — BP 132/88 | HR 93 | Temp 97.1°F | Ht 70.0 in | Wt 229.0 lb

## 2018-06-21 DIAGNOSIS — J40 Bronchitis, not specified as acute or chronic: Secondary | ICD-10-CM | POA: Diagnosis not present

## 2018-06-21 DIAGNOSIS — J329 Chronic sinusitis, unspecified: Secondary | ICD-10-CM

## 2018-06-21 MED ORDER — SULFAMETHOXAZOLE-TRIMETHOPRIM 800-160 MG PO TABS: 1.0000 | ORAL_TABLET | Freq: Two times a day (BID) | ORAL | 0 refills | Status: DC

## 2018-06-21 NOTE — Progress Notes (Signed)
Chief Complaint  Patient presents with  . Nasal Congestion    HPI  Patient presents today for Patient presents with upper respiratory congestion. Rhinorrhea that is frequently purulent. There is moderate sore throat. Patient reports coughing frequently as well.  green sputum noted. There is no fever, chills or sweats. The patient denies being short of breath. Onset was 3-5 days ago. Gradually worsening. Tried OTCs without improvement.  PMH: Smoking status noted ROS: Per HPI  Objective: BP 132/88   Pulse 93   Temp (!) 97.1 F (36.2 C) (Oral)   Ht 5\' 10"  (1.778 m)   Wt 229 lb (103.9 kg)   BMI 32.86 kg/m  Gen: NAD, alert, cooperative with exam HEENT: NCAT, Nasal passages swollen, red TMS RED CV: RRR, good S1/S2, no murmur Resp: Bronchitis changes with scattered wheezes, non-labored Ext: No edema, warm Neuro: Alert and oriented, No gross deficits  Assessment and plan:  1. Sinobronchitis     Meds ordered this encounter  Medications  . sulfamethoxazole-trimethoprim (BACTRIM DS) 800-160 MG tablet    Sig: Take 1 tablet by mouth 2 (two) times daily.    Dispense:  20 tablet    Refill:  0    No orders of the defined types were placed in this encounter.   Follow up as needed.  Claretta Fraise, MD

## 2018-06-23 DIAGNOSIS — G932 Benign intracranial hypertension: Secondary | ICD-10-CM | POA: Diagnosis not present

## 2018-06-26 DIAGNOSIS — G932 Benign intracranial hypertension: Secondary | ICD-10-CM | POA: Diagnosis not present

## 2018-06-29 ENCOUNTER — Encounter: Payer: Self-pay | Admitting: Family Medicine

## 2018-06-30 ENCOUNTER — Other Ambulatory Visit: Payer: Self-pay | Admitting: Family Medicine

## 2018-06-30 MED ORDER — LEVOFLOXACIN 500 MG PO TABS: 500.0000 mg | ORAL_TABLET | Freq: Every day | ORAL | 0 refills | Status: DC

## 2018-07-02 ENCOUNTER — Encounter (HOSPITAL_COMMUNITY): Payer: Self-pay | Admitting: *Deleted

## 2018-07-02 ENCOUNTER — Ambulatory Visit (HOSPITAL_COMMUNITY)
Admission: EM | Admit: 2018-07-02 | Discharge: 2018-07-02 | Disposition: A | Discharge status: Home / Self Care | Payer: BLUE CROSS/BLUE SHIELD | Attending: Family Medicine | Admitting: Family Medicine

## 2018-07-02 ENCOUNTER — Other Ambulatory Visit: Payer: Self-pay

## 2018-07-02 DIAGNOSIS — J683 Other acute and subacute respiratory conditions due to chemicals, gases, fumes and vapors: Secondary | ICD-10-CM

## 2018-07-02 MED ORDER — HYDROCODONE-HOMATROPINE 5-1.5 MG/5ML PO SYRP: 5.0000 mL | ORAL_SOLUTION | Freq: Four times a day (QID) | ORAL | 0 refills | Status: DC | PRN

## 2018-07-02 MED ORDER — PREDNISONE 50 MG PO TABS: ORAL_TABLET | ORAL | 1 refills | Status: DC

## 2018-07-02 NOTE — Discharge Instructions (Addendum)
You should see improvement in the next 24 hours.

## 2018-07-02 NOTE — ED Triage Notes (Signed)
C/O cough x approx 1 month.  Has been to PCP x 2 - completed course of Sulfa abx, was not any better, started levofloxacin 2 days ago.  States feeling worse over last couple days.  C/O constant cough.

## 2018-07-02 NOTE — ED Provider Notes (Signed)
[facility]    CSN: [id number] Arrival date & time: 07/02/18  1002     History   Chief Complaint Chief Complaint  Patient presents with  . Cough    HPI Heidi Gibbs is a 27 y.o. female.   Initial visit for this 27 year old woman with cough.   C/O cough x approx 1 month.  Has been to PCP x 2 - completed course of Sulfa abx, was not any better, started levofloxacin 2 days ago.  States feeling worse over last couple days.  C/O constant cough.  Patient coughed to the point of gagging.  She has never had asthma.  She does not smoke.  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  Patient cough is minimally productive and she has no congestion at this point.  Her muscles are sore in her chest from coughing so much.      Past Medical History:  Diagnosis Date  . Brain tumor (benign) ([place])   . DDD (degenerative disc disease), lumbar   . Generalized anxiety disorder   . Insomnia   . Migraines     Patient Active Problem List   Diagnosis Date Noted  . Essential hypertension 04/21/2018  . Idiopathic intracranial hypertension 04/21/2018  . BMI 33.0-33.9,adult 04/21/2018  . Primary fibromyalgia syndrome 10/20/2016  . S/P spinal surgery 07/23/2016  . Depression, major, single episode, mild ([place]) 06/07/2016  . GERD (gastroesophageal reflux disease) 06/07/2016  . Obesity 06/07/2016  . HNP (herniated nucleus pulposus), lumbar 12/09/2015  . Sleeping difficulty 07/29/2015  . Migraine without status migrainosus, not intractable 07/29/2015  . Generalized anxiety disorder 01/28/2015  . Cardiac murmur, unspecified 03/29/2014    Past Surgical History:  Procedure Laterality Date  . BACK SURGERY    . LUMBAR DISC SURGERY  06/21/2016  . WISDOM TOOTH EXTRACTION      OB History    Gravida  0   Para  0   Term  0   Preterm  0   AB  0   Living  0     SAB  0   TAB  0   Ectopic  0   Multiple  0   Live Births  0            Home Medications    Prior to Admission medications   Medication Sig Start Date End Date Taking? Authorizing Provider  acetaZOLAMIDE (DIAMOX) 500 MG capsule Take 500 mg by mouth 2 (two) times daily.   Yes [provider]  Ascorbic Acid (VITAMIN C CR) 500 MG CPCR Vitamin C 500 mg capsule,extended release  Take by oral route.   Yes [provider]  benzonatate (TESSALON) 200 MG capsule Take 1 capsule (200 mg total) by mouth 3 (three) times daily as needed. 05/26/18  Yes Hawks, Christy A, FNP  JOLESSA 0.15-0.03 MG tablet Take 1 tablet by mouth daily. 08/25/15  Yes [provider]  levofloxacin (LEVAQUIN) 500 MG tablet Take 1 tablet (500 mg total) by mouth daily. For 10 days 06/30/18  Yes Stacks, Cletus Gash, MD  metoprolol succinate (TOPROL-XL) 25 MG 24 hr tablet Take 1 tablet (25 mg total) by mouth daily. 04/21/18 07/20/18 Yes Rakes, Connye Burkitt, FNP  sulfamethoxazole-trimethoprim (BACTRIM DS) 800-160 MG tablet Take 1 tablet by mouth 2 (two) times daily. 06/21/18  Yes Claretta Fraise, MD  traZODone (DESYREL) 100 MG tablet Take 0.5 tablets (50 mg total) by mouth at bedtime as needed for sleep. 04/21/18  Yes Rakes, Connye Burkitt, FNP  HYDROcodone-homatropine (HYDROMET) 5-1.5 MG/5ML syrup Take 5 mLs by mouth every 6 (six) hours as needed for cough. 07/02/18   Robyn Haber, MD  omeprazole (PRILOSEC) 20 MG capsule Take 20 mg by mouth daily as needed (for indigestion).  10/29/15   [provider]  predniSONE (DELTASONE) 50 MG tablet 1 daily with food 07/02/18   Robyn Haber, MD    Family History Family History  Problem Relation Age of Onset  . Diabetes Mother   . Hypertension Mother     Social History Social History   Tobacco Use  . Smoking status: Never Smoker  . Smokeless tobacco: Never Used  Substance Use Topics  . Alcohol use: No  . Drug use: No     Allergies   Erythromycin; Penicillins; Tamiflu [oseltamivir phosphate]; Clindamycin; Latex; and  Oseltamivir   Review of Systems Review of Systems   Physical Exam Triage Vital Signs ED Triage Vitals  Enc Vitals Group     BP 07/02/18 1029 (!) 148/84     Pulse Rate 07/02/18 1029 84     Resp 07/02/18 1029 16     Temp 07/02/18 1029 97.9 F (36.6 C)     Temp Source 07/02/18 1029 Oral     SpO2 07/02/18 1029 98 %     Weight --      Height --      Head Circumference --      Peak Flow --      Pain Score 07/02/18 1030 5     Pain Loc --      Pain Edu? --      Excl. in [place]? --    No data found.  Updated Vital Signs BP (!) 148/84   Pulse 84   Temp 97.9 F (36.6 C) (Oral)   Resp 16   SpO2 98%   Physical Exam Vitals signs and nursing note reviewed.  Constitutional:      Appearance: Normal appearance.  HENT:     Head: Normocephalic.     Nose: Nose normal.     Mouth/Throat:     Mouth: Mucous membranes are moist.  Eyes:     Conjunctiva/sclera: Conjunctivae normal.  Cardiovascular:     Rate and Rhythm: Normal rate.     Heart sounds: Normal heart sounds.  Pulmonary:     Effort: Pulmonary effort is normal.     Breath sounds: Normal breath sounds.  Musculoskeletal: Normal range of motion.  Skin:    General: Skin is warm and dry.  Neurological:     General: No focal deficit present.     Mental Status: She is alert and oriented to person, place, and time.  Psychiatric:        Mood and Affect: Mood normal.        Behavior: Behavior normal.        Thought Content: Thought content normal.        Judgment: Judgment normal.      UC Treatments / Results  Labs (all labs ordered are listed, but only abnormal results are displayed) Labs Reviewed - No data to display  EKG None  Radiology No results found.  Procedures Procedures (including critical care time)  Medications Ordered in UC Medications - No data to display  Initial Impression / Assessment and Plan / UC Course  I have reviewed the triage vital signs and the nursing notes.  Pertinent labs & imaging  results that were available during my care of the patient  were reviewed by me and considered in my medical decision making (see chart for details).    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    ED Prescriptions    Medication Sig Dispense Auth. Provider   predniSONE (DELTASONE) 50 MG tablet 1 daily with food 5 tablet Robyn Haber, MD   HYDROcodone-homatropine (HYDROMET) 5-1.5 MG/5ML syrup Take 5 mLs by mouth every 6 (six) hours as needed for cough. 60 mL Robyn Haber, MD     Controlled Substance Prescriptions [place] Controlled Substance Registry consulted? Not Applicable   Robyn Haber, MD 07/02/18 1045

## 2018-07-03 DIAGNOSIS — Z113 Encounter for screening for infections with a predominantly sexual mode of transmission: Secondary | ICD-10-CM | POA: Diagnosis not present

## 2018-07-03 DIAGNOSIS — Z01419 Encounter for gynecological examination (general) (routine) without abnormal findings: Secondary | ICD-10-CM | POA: Diagnosis not present

## 2018-07-03 DIAGNOSIS — Z124 Encounter for screening for malignant neoplasm of cervix: Secondary | ICD-10-CM | POA: Diagnosis not present

## 2018-07-03 DIAGNOSIS — Z6832 Body mass index (BMI) 32.0-32.9, adult: Secondary | ICD-10-CM | POA: Diagnosis not present

## 2018-07-05 ENCOUNTER — Encounter: Payer: Self-pay | Admitting: Family Medicine

## 2018-08-15 DIAGNOSIS — R197 Diarrhea, unspecified: Secondary | ICD-10-CM | POA: Diagnosis not present

## 2018-08-15 DIAGNOSIS — R112 Nausea with vomiting, unspecified: Secondary | ICD-10-CM | POA: Diagnosis not present

## 2018-08-21 DIAGNOSIS — R1032 Left lower quadrant pain: Secondary | ICD-10-CM | POA: Diagnosis not present

## 2018-08-21 DIAGNOSIS — R1013 Epigastric pain: Secondary | ICD-10-CM | POA: Diagnosis not present

## 2018-08-21 DIAGNOSIS — G932 Benign intracranial hypertension: Secondary | ICD-10-CM | POA: Diagnosis not present

## 2018-11-14 ENCOUNTER — Other Ambulatory Visit: Payer: Self-pay

## 2018-11-15 ENCOUNTER — Encounter: Payer: Self-pay | Admitting: Family Medicine

## 2018-11-15 ENCOUNTER — Ambulatory Visit: Payer: BLUE CROSS/BLUE SHIELD | Admitting: Family Medicine

## 2018-11-15 VITALS — BP 133/79 | HR 107 | Temp 97.5°F | Ht 70.0 in | Wt 244.0 lb

## 2018-11-15 DIAGNOSIS — R42 Dizziness and giddiness: Secondary | ICD-10-CM | POA: Diagnosis not present

## 2018-11-15 DIAGNOSIS — E782 Mixed hyperlipidemia: Secondary | ICD-10-CM

## 2018-11-15 DIAGNOSIS — R5383 Other fatigue: Secondary | ICD-10-CM

## 2018-11-15 DIAGNOSIS — Z6835 Body mass index (BMI) 35.0-35.9, adult: Secondary | ICD-10-CM | POA: Diagnosis not present

## 2018-11-15 LAB — BAYER DCA HB A1C WAIVED: HB A1C (BAYER DCA - WAIVED): 5.4 % (ref <7.0)

## 2018-11-15 LAB — GLUCOSE HEMOCUE WAIVED: Glu Hemocue Waived: 138 mg/dL — ABNORMAL HIGH (ref 65–99)

## 2018-11-15 NOTE — Progress Notes (Signed)
Subjective:  Patient ID: Heidi Gibbs, female    DOB: 09-06-1990, 28 y.o.   MRN: [id number]  Chief Complaint:  Dizziness (Patient states it has been going on for a month - low bs? ); Nausea; Headache; and Fatigue   HPI: Heidi Gibbs is a 28 y.o. female presenting on 11/15/2018 for Dizziness (Patient states it has been going on for a month - low bs? ); Nausea; Headache; and Fatigue   Pt presents today for intermittent and random dizziness, nausea, headaches, and fatigue. States movement or changes in position do not affect the symptoms. States eating a snack will help. States both of her parents have DM type II. She denies focal deficits or weakness. No slurred speech or facial drooping. No chest pain, palpitations, or shortness of breath. She denies abnormal bruising or bleeding.   Dizziness  This is a new problem. The current episode started more than 1 month ago. The problem occurs intermittently. The problem has been waxing and waning. Associated symptoms include diaphoresis, fatigue, headaches and nausea. Pertinent negatives include no abdominal pain, anorexia, arthralgias, change in bowel habit, chest pain, chills, congestion, coughing, fever, joint swelling, myalgias, neck pain, numbness, rash, sore throat, swollen glands, urinary symptoms, vertigo, visual change, vomiting or weakness. Nothing aggravates the symptoms. She has tried eating for the symptoms. The treatment provided moderate relief.  Headache   This is a recurrent problem. The current episode started more than 1 month ago. The problem occurs intermittently (with dizzy spells). The problem has been waxing and waning. The pain is located in the frontal region. The pain does not radiate. The quality of the pain is described as aching. The pain is at a severity of 2/10. The pain is mild. Associated symptoms include dizziness and nausea. Pertinent negatives include no abdominal pain, abnormal behavior, anorexia, back  pain, blurred vision, coughing, drainage, ear pain, eye pain, eye redness, eye watering, facial sweating, fever, hearing loss, insomnia, loss of balance, muscle aches, neck pain, numbness, phonophobia, photophobia, rhinorrhea, scalp tenderness, seizures, sinus pressure, sore throat, swollen glands, tingling, tinnitus, visual change, vomiting, weakness or weight loss. Nothing aggravates the symptoms. She has tried nothing for the symptoms.     Relevant past medical, surgical, family, and social history reviewed and updated as indicated.  Allergies and medications reviewed and updated.   Past Medical History:  Diagnosis Date  . Brain tumor (benign) ([place])   . DDD (degenerative disc disease), lumbar   . Generalized anxiety disorder   . Insomnia   . Migraines     Past Surgical History:  Procedure Laterality Date  . BACK SURGERY    . LUMBAR DISC SURGERY  06/21/2016  . WISDOM TOOTH EXTRACTION      Social History   Socioeconomic History  . Marital status: Single    Spouse name: Not on file  . Number of children: Not on file  . Years of education: Not on file  . Highest education level: Not on file  Occupational History  . Not on file  Social Needs  . Financial resource strain: Not on file  . Food insecurity:    Worry: Not on file    Inability: Not on file  . Transportation needs:    Medical: Not on file    Non-medical: Not on file  Tobacco Use  . Smoking status: Never Smoker  . Smokeless tobacco: Never Used  Substance and Sexual Activity  . Alcohol use: No  . Drug use: No  .  Sexual activity: Not on file  Lifestyle  . Physical activity:    Days per week: Not on file    Minutes per session: Not on file  . Stress: Not on file  Relationships  . Social connections:    Talks on phone: Not on file    Gets together: Not on file    Attends religious service: Not on file    Active member of club or organization: Not on file    Attends meetings of clubs or organizations: Not  on file    Relationship status: Not on file  . Intimate partner violence:    Fear of current or ex partner: Not on file    Emotionally abused: Not on file    Physically abused: Not on file    Forced sexual activity: Not on file  Other Topics Concern  . Not on file  Social History Narrative  . Not on file    Outpatient Encounter Medications as of 11/15/2018  Medication Sig  . acetaZOLAMIDE (DIAMOX) 500 MG capsule Take 500 mg by mouth 2 (two) times daily.  . Ascorbic Acid (VITAMIN C CR) 500 MG CPCR Vitamin C 500 mg capsule,extended release  Take by oral route.  Thurston Pounds 0.15-0.03 MG tablet Take 1 tablet by mouth daily.  Marland Kitchen omeprazole (PRILOSEC) 20 MG capsule Take 20 mg by mouth daily as needed (for indigestion).   . traZODone (DESYREL) 100 MG tablet Take 0.5 tablets (50 mg total) by mouth at bedtime as needed for sleep.  . metoprolol succinate (TOPROL-XL) 25 MG 24 hr tablet Take 1 tablet (25 mg total) by mouth daily.  . [DISCONTINUED] benzonatate (TESSALON) 200 MG capsule Take 1 capsule (200 mg total) by mouth 3 (three) times daily as needed.  . [DISCONTINUED] HYDROcodone-homatropine (HYDROMET) 5-1.5 MG/5ML syrup Take 5 mLs by mouth every 6 (six) hours as needed for cough.  . [DISCONTINUED] levofloxacin (LEVAQUIN) 500 MG tablet Take 1 tablet (500 mg total) by mouth daily. For 10 days  . [DISCONTINUED] predniSONE (DELTASONE) 50 MG tablet 1 daily with food  . [DISCONTINUED] sulfamethoxazole-trimethoprim (BACTRIM DS) 800-160 MG tablet Take 1 tablet by mouth 2 (two) times daily.   No facility-administered encounter medications on file as of 11/15/2018.     Allergies  Allergen Reactions  . Erythromycin Hives  . Penicillins Nausea And Vomiting    Has patient had a PCN reaction causing immediate rash, facial/tongue/throat swelling, SOB or lightheadedness with hypotension: No Has patient had a PCN reaction causing severe rash involving mucus membranes or skin necrosis: No Has patient had a  PCN reaction that required hospitalization No Has patient had a PCN reaction occurring within the last 10 years: Yes If all of the above answers are "NO", then may proceed with Cephalosporin use.   . Tamiflu [Oseltamivir Phosphate] Nausea And Vomiting  . Clindamycin Hives  . Latex   . Oseltamivir Nausea And Vomiting    Review of Systems  Constitutional: Positive for diaphoresis and fatigue. Negative for activity change, appetite change, chills, fever, unexpected weight change and weight loss.  HENT: Negative for congestion, ear pain, hearing loss, rhinorrhea, sinus pressure, sore throat and tinnitus.   Eyes: Negative for blurred vision, photophobia, pain, redness and visual disturbance.  Respiratory: Negative for cough and shortness of breath.   Cardiovascular: Negative for chest pain, palpitations and leg swelling.  Gastrointestinal: Positive for nausea. Negative for abdominal pain, anal bleeding, anorexia, blood in stool, change in bowel habit and vomiting.  Endocrine: Negative for cold  intolerance, heat intolerance, polydipsia, polyphagia and polyuria.  Genitourinary: Negative for decreased urine volume, difficulty urinating, menstrual problem, urgency and vaginal bleeding.  Musculoskeletal: Negative for arthralgias, back pain, joint swelling, myalgias and neck pain.  Skin: Negative for rash.  Neurological: Positive for dizziness, light-headedness and headaches. Negative for vertigo, tingling, tremors, seizures, syncope, facial asymmetry, speech difficulty, weakness, numbness and loss of balance.  Hematological: Negative for adenopathy. Does not bruise/bleed easily.  Psychiatric/Behavioral: Negative for confusion. The patient does not have insomnia.   All other systems reviewed and are negative.       Objective:  BP 133/79   Pulse (!) 107   Temp (!) 97.5 F (36.4 C) (Oral)   Ht _0  (1.778 m)   Wt 244 lb (110.7 kg)   BMI 35.01 kg/m    Wt Readings from Last 3 Encounters:   11/15/18 244 lb (110.7 kg)  06/21/18 229 lb (103.9 kg)  05/26/18 229 lb 3.2 oz (104 kg)    Physical Exam Vitals signs and nursing note reviewed.  Constitutional:      General: She is not in acute distress.    Appearance: Normal appearance. She is well-developed. She is obese. She is not ill-appearing, toxic-appearing or diaphoretic.  HENT:     Head: Normocephalic and atraumatic.     Mouth/Throat:     Mouth: Mucous membranes are moist.  Eyes:     Extraocular Movements: Extraocular movements intact.     Conjunctiva/sclera: Conjunctivae normal.     Pupils: Pupils are equal, round, and reactive to light.  Neck:     Musculoskeletal: Normal range of motion and neck supple.  Cardiovascular:     Rate and Rhythm: Normal rate and regular rhythm.     Heart sounds: Normal heart sounds. No murmur. No friction rub. No gallop.   Pulmonary:     Effort: Pulmonary effort is normal. No respiratory distress.     Breath sounds: Normal breath sounds.  Abdominal:     General: Bowel sounds are normal.     Palpations: Abdomen is soft.     Tenderness: There is no abdominal tenderness.  Skin:    General: Skin is warm and dry.     Capillary Refill: Capillary refill takes less than 2 seconds.     Coloration: Skin is not cyanotic or pale.     Findings: No bruising.  Neurological:     General: No focal deficit present.     Mental Status: She is alert and oriented to person, place, and time.     Cranial Nerves: No cranial nerve deficit.     Sensory: No sensory deficit.     Motor: No weakness.     Coordination: Coordination normal.     Gait: Gait normal.     Deep Tendon Reflexes: Reflexes normal.  Psychiatric:        Mood and Affect: Mood normal.        Speech: Speech normal.        Behavior: Behavior normal.        Thought Content: Thought content normal.        Judgment: Judgment normal.     Results for orders placed or performed in visit on 04/21/18  CBC with Differential/Platelet  Result  Value Ref Range   WBC 10.0 3.4 - 10.8 x10E3/uL   RBC 5.02 3.77 - 5.28 x10E6/uL   Hemoglobin 14.2 11.1 - 15.9 g/dL   Hematocrit 42.8 34.0 - 46.6 %   MCV 85 79 - 97 fL  MCH 28.3 26.6 - 33.0 pg   MCHC 33.2 31.5 - 35.7 g/dL   RDW 13.1 12.3 - 15.4 %   Platelets 393 150 - 450 x10E3/uL   Neutrophils 56 Not Estab. %   Lymphs 35 Not Estab. %   Monocytes 7 Not Estab. %   Eos 1 Not Estab. %   Basos 1 Not Estab. %   Neutrophils Absolute 5.6 1.4 - 7.0 x10E3/uL   Lymphocytes Absolute 3.5 (H) 0.7 - 3.1 x10E3/uL   Monocytes Absolute 0.7 0.1 - 0.9 x10E3/uL   EOS (ABSOLUTE) 0.1 0.0 - 0.4 x10E3/uL   Basophils Absolute 0.1 0.0 - 0.2 x10E3/uL   Immature Granulocytes 0 Not Estab. %   Immature Grans (Abs) 0.0 0.0 - 0.1 x10E3/uL  CMP14+EGFR  Result Value Ref Range   Glucose 75 65 - 99 mg/dL   BUN 7 6 - 20 mg/dL   Creatinine, Ser 0.77 0.57 - 1.00 mg/dL   GFR calc non Af Amer 108 >59 mL/min/1.73   GFR calc Af Amer 125 >59 mL/min/1.73   BUN/Creatinine Ratio 9 9 - 23   Sodium 141 134 - 144 mmol/L   Potassium 3.6 3.5 - 5.2 mmol/L   Chloride 106 96 - 106 mmol/L   CO2 19 (L) 20 - 29 mmol/L   Calcium 9.1 8.7 - 10.2 mg/dL   Total Protein 7.0 6.0 - 8.5 g/dL   Albumin 4.1 3.5 - 5.5 g/dL   Globulin, Total 2.9 1.5 - 4.5 g/dL   Albumin/Globulin Ratio 1.4 1.2 - 2.2   Bilirubin Total <0.2 0.0 - 1.2 mg/dL   Alkaline Phosphatase 73 39 - 117 IU/L   AST 13 0 - 40 IU/L   ALT 13 0 - 32 IU/L  Lipid panel  Result Value Ref Range   Cholesterol, Total 245 (H) 100 - 199 mg/dL   Triglycerides 157 (H) 0 - 149 mg/dL   HDL 42 >39 mg/dL   VLDL Cholesterol Cal 31 5 - 40 mg/dL   LDL Calculated 172 (H) 0 - 99 mg/dL   Chol/HDL Ratio 5.8 (H) 0.0 - 4.4 ratio  TSH  Result Value Ref Range   TSH 1.150 0.450 - 4.500 uIU/mL  Microalbumin / creatinine urine ratio  Result Value Ref Range   Creatinine, Urine 381.4 Not Estab. mg/dL   Microalbumin, Urine 28.3 Not Estab. ug/mL   Microalb/Creat Ratio 7.4 0.0 - 30.0 mg/g creat   HIV Antibody (routine testing w rflx)  Result Value Ref Range   HIV Screen 4th Generation wRfx Non Reactive Non Reactive  QuantiFERON-TB Gold Plus  Result Value Ref Range   QuantiFERON Incubation Incubation performed.    QuantiFERON Criteria Comment    QuantiFERON TB1 Ag Value 0.02 IU/mL   QuantiFERON TB2 Ag Value 0.04 IU/mL   QuantiFERON Nil Value 0.02 IU/mL   QuantiFERON Mitogen Value >10.00 IU/mL   QuantiFERON-TB Gold Plus Negative Negative       Pertinent labs & imaging results that were available during my care of the patient were reviewed by me and considered in my medical decision making.  Assessment & Plan:  Ahnesti was seen today for dizziness, nausea, headache and fatigue.  Diagnoses and all orders for this visit:  Dizziness FSBS 138 in office.  Intermittent periods of dizziness, nausea, slight headache, and fatigue. Symptoms improve with eating a snack. Monitor and keep a record of symptoms. Labs pending. Follow up in 4 weeks.  -     CMP14+EGFR -     CBC with Differential/Platelet -  TSH -     Bayer DCA Hb A1c Waived -     VITAMIN D 25 Hydroxy (Vit-D Deficiency, Fractures) -     Vitamin B12  Mixed hyperlipidemia Not taking Red Yeast Rice or statin. Will recheck levels today. Labs pending. Diet and exercise encouraged.  -     CMP14+EGFR -     Lipid panel  Other fatigue Intermittent and ongoing fatigue for past 1-2 months. Labs pending. Sleep hygiene discussed. Report any new or worsening symptoms.  -     CMP14+EGFR -     CBC with Differential/Platelet -     TSH -     Bayer DCA Hb A1c Waived -     VITAMIN D 25 Hydroxy (Vit-D Deficiency, Fractures) -     Vitamin B12  BMI 35.0-35.9,adult Diet and exercise encouraged. Labs pending.  -     TSH -     Bayer DCA Hb A1c Waived -     Lipid panel     Continue all other maintenance medications.  Follow up plan: Return in about 4 weeks (around 12/13/2018), or if symptoms worsen or fail to improve, for  dizziness.  The above assessment and management plan was discussed with the patient. The patient verbalized understanding of and has agreed to the management plan. Patient is aware to call the clinic if symptoms persist or worsen. Patient is aware when to return to the clinic for a follow-up visit. Patient educated on when it is appropriate to go to the emergency department.   Monia Pouch, FNP-C [facility] [phone]

## 2018-11-16 LAB — LIPID PANEL
Chol/HDL Ratio: 4.3 ratio (ref 0.0–4.4)
Cholesterol, Total: 232 mg/dL — ABNORMAL HIGH (ref 100–199)
HDL: 54 mg/dL (ref >39)
LDL Calculated: 152 mg/dL — ABNORMAL HIGH (ref 0–99)
Triglycerides: 132 mg/dL (ref 0–149)
VLDL Cholesterol Cal: 26 mg/dL (ref 5–40)

## 2018-11-16 LAB — CMP14+EGFR
ALT: 10 IU/L (ref 0–32)
AST: 13 IU/L (ref 0–40)
Albumin/Globulin Ratio: 1.5 (ref 1.2–2.2)
Albumin: 4.1 g/dL (ref 3.9–5.0)
Alkaline Phosphatase: 63 IU/L (ref 39–117)
BUN/Creatinine Ratio: 12 (ref 9–23)
BUN: 9 mg/dL (ref 6–20)
Bilirubin Total: 0.2 mg/dL (ref 0.0–1.2)
CO2: 21 mmol/L (ref 20–29)
Calcium: 9.3 mg/dL (ref 8.7–10.2)
Chloride: 105 mmol/L (ref 96–106)
Creatinine, Ser: 0.77 mg/dL (ref 0.57–1.00)
GFR calc Af Amer: 124 mL/min/{1.73_m2} (ref >59)
GFR calc non Af Amer: 108 mL/min/{1.73_m2} (ref >59)
Globulin, Total: 2.8 g/dL (ref 1.5–4.5)
Glucose: 131 mg/dL — ABNORMAL HIGH (ref 65–99)
Potassium: 3.9 mmol/L (ref 3.5–5.2)
Sodium: 141 mmol/L (ref 134–144)
Total Protein: 6.9 g/dL (ref 6.0–8.5)

## 2018-11-16 LAB — CBC WITH DIFFERENTIAL/PLATELET
Basophils Absolute: 0 10*3/uL (ref 0.0–0.2)
Basos: 1 %
EOS (ABSOLUTE): 0.2 10*3/uL (ref 0.0–0.4)
Eos: 3 %
Hematocrit: 41.9 % (ref 34.0–46.6)
Hemoglobin: 13.9 g/dL (ref 11.1–15.9)
Immature Grans (Abs): 0 10*3/uL (ref 0.0–0.1)
Immature Granulocytes: 0 %
Lymphocytes Absolute: 3.1 10*3/uL (ref 0.7–3.1)
Lymphs: 37 %
MCH: 28.5 pg (ref 26.6–33.0)
MCHC: 33.2 g/dL (ref 31.5–35.7)
MCV: 86 fL (ref 79–97)
Monocytes Absolute: 0.5 10*3/uL (ref 0.1–0.9)
Monocytes: 6 %
Neutrophils Absolute: 4.5 10*3/uL (ref 1.4–7.0)
Neutrophils: 53 %
Platelets: 366 10*3/uL (ref 150–450)
RBC: 4.87 x10E6/uL (ref 3.77–5.28)
RDW: 12.2 % (ref 11.7–15.4)
WBC: 8.4 10*3/uL (ref 3.4–10.8)

## 2018-11-16 LAB — TSH: TSH: 1.47 u[IU]/mL (ref 0.450–4.500)

## 2018-11-16 LAB — VITAMIN D 25 HYDROXY (VIT D DEFICIENCY, FRACTURES): Vit D, 25-Hydroxy: 36.7 ng/mL (ref 30.0–100.0)

## 2018-11-16 LAB — VITAMIN B12: Vitamin B-12: 220 pg/mL — ABNORMAL LOW (ref 232–1245)

## 2018-12-13 ENCOUNTER — Ambulatory Visit: Payer: BC Managed Care – PPO | Admitting: Family Medicine

## 2018-12-13 ENCOUNTER — Other Ambulatory Visit: Payer: Self-pay

## 2018-12-14 ENCOUNTER — Encounter: Payer: Self-pay | Admitting: Family Medicine

## 2018-12-14 ENCOUNTER — Ambulatory Visit: Payer: BC Managed Care – PPO | Admitting: Family Medicine

## 2018-12-14 VITALS — BP 136/86 | HR 112 | Temp 97.7°F | Ht 70.0 in | Wt 243.0 lb

## 2018-12-14 DIAGNOSIS — R42 Dizziness and giddiness: Secondary | ICD-10-CM

## 2018-12-14 NOTE — Progress Notes (Signed)
Subjective:  Patient ID: Heidi Gibbs, female    DOB: 28-Feb-1991, 28 y.o.   MRN: [id number]  Chief Complaint:  Dizziness   HPI: Heidi Gibbs is a 28 y.o. female presenting on 12/14/2018 for Dizziness  Pt presents today for follow up of blood sugar drops and dizziness. Pt states she has been trying to eat more often and snack throughout the day. States she has had less frequent episodes. States the dizziness only happens when she has not eaten anything and subsides when she eats something. No other associated symptoms.   Relevant past medical, surgical, family, and social history reviewed and updated as indicated.  Allergies and medications reviewed and updated.   Past Medical History:  Diagnosis Date  . Brain tumor (benign) ([place])   . DDD (degenerative disc disease), lumbar   . Generalized anxiety disorder   . Insomnia   . Migraines     Past Surgical History:  Procedure Laterality Date  . BACK SURGERY    . LUMBAR DISC SURGERY  06/21/2016  . WISDOM TOOTH EXTRACTION      Social History   Socioeconomic History  . Marital status: Single    Spouse name: Not on file  . Number of children: Not on file  . Years of education: Not on file  . Highest education level: Not on file  Occupational History  . Not on file  Social Needs  . Financial resource strain: Not on file  . Food insecurity    Worry: Not on file    Inability: Not on file  . Transportation needs    Medical: Not on file    Non-medical: Not on file  Tobacco Use  . Smoking status: Never Smoker  . Smokeless tobacco: Never Used  Substance and Sexual Activity  . Alcohol use: No  . Drug use: No  . Sexual activity: Not on file  Lifestyle  . Physical activity    Days per week: Not on file    Minutes per session: Not on file  . Stress: Not on file  Relationships  . Social Herbalist on phone: Not on file    Gets together: Not on file    Attends religious service: Not on file   Active member of club or organization: Not on file    Attends meetings of clubs or organizations: Not on file    Relationship status: Not on file  . Intimate partner violence    Fear of current or ex partner: Not on file    Emotionally abused: Not on file    Physically abused: Not on file    Forced sexual activity: Not on file  Other Topics Concern  . Not on file  Social History Narrative  . Not on file    Outpatient Encounter Medications as of 12/14/2018  Medication Sig  . acetaZOLAMIDE (DIAMOX) 500 MG capsule Take 500 mg by mouth 2 (two) times daily.  . Ascorbic Acid (VITAMIN C CR) 500 MG CPCR Vitamin C 500 mg capsule,extended release  Take by oral route.  Thurston Pounds 0.15-0.03 MG tablet Take 1 tablet by mouth daily.  . metoprolol succinate (TOPROL-XL) 25 MG 24 hr tablet Take 1 tablet (25 mg total) by mouth daily.  . traZODone (DESYREL) 100 MG tablet Take 0.5 tablets (50 mg total) by mouth at bedtime as needed for sleep.   No facility-administered encounter medications on file as of 12/14/2018.     Allergies  Allergen Reactions  .  Erythromycin Hives  . Penicillins Nausea And Vomiting    Has patient had a PCN reaction causing immediate rash, facial/tongue/throat swelling, SOB or lightheadedness with hypotension: No Has patient had a PCN reaction causing severe rash involving mucus membranes or skin necrosis: No Has patient had a PCN reaction that required hospitalization No Has patient had a PCN reaction occurring within the last 10 years: Yes If all of the above answers are "NO", then may proceed with Cephalosporin use.   . Tamiflu [Oseltamivir Phosphate] Nausea And Vomiting  . Clindamycin Hives  . Latex   . Oseltamivir Nausea And Vomiting    Review of Systems  Constitutional: Negative for activity change, appetite change, chills, diaphoresis, fatigue, fever and unexpected weight change.  Eyes: Negative for photophobia and visual disturbance.  Respiratory: Negative for  cough, chest tightness, shortness of breath and wheezing.   Cardiovascular: Negative for chest pain, palpitations and leg swelling.  Gastrointestinal: Negative for abdominal pain, constipation, diarrhea, nausea and vomiting.  Genitourinary: Negative for decreased urine volume and difficulty urinating.  Musculoskeletal: Negative for arthralgias and myalgias.  Neurological: Positive for dizziness. Negative for tremors, seizures, syncope, facial asymmetry, speech difficulty, weakness, light-headedness, numbness and headaches.  Psychiatric/Behavioral: Negative for confusion.  All other systems reviewed and are negative.       Objective:  BP 136/86 (BP Location: Right Arm, Cuff Size: Normal)   Pulse (!) 112   Temp 97.7 F (36.5 C) (Oral)   Ht 5' 10"  (1.778 m)   Wt 243 lb (110.2 kg)   BMI 34.87 kg/m    Wt Readings from Last 3 Encounters:  12/14/18 243 lb (110.2 kg)  11/15/18 244 lb (110.7 kg)  06/21/18 229 lb (103.9 kg)    Physical Exam Vitals signs and nursing note reviewed.  Constitutional:      General: She is not in acute distress.    Appearance: Normal appearance. She is well-developed and well-groomed. She is not ill-appearing, toxic-appearing or diaphoretic.  HENT:     Head: Normocephalic and atraumatic.     Jaw: There is normal jaw occlusion.     Right Ear: Hearing normal.     Left Ear: Hearing normal.     Nose: Nose normal.     Mouth/Throat:     Lips: Pink.     Mouth: Mucous membranes are moist.     Pharynx: Oropharynx is clear. Uvula midline.  Eyes:     General: Lids are normal.     Extraocular Movements: Extraocular movements intact.     Conjunctiva/sclera: Conjunctivae normal.     Pupils: Pupils are equal, round, and reactive to light.  Neck:     Musculoskeletal: Normal range of motion and neck supple.     Thyroid: No thyroid mass, thyromegaly or thyroid tenderness.     Vascular: No carotid bruit or JVD.     Trachea: Trachea and phonation normal.   Cardiovascular:     Rate and Rhythm: Normal rate and regular rhythm.     Chest Wall: PMI is not displaced.     Pulses: Normal pulses.     Heart sounds: Normal heart sounds. No murmur. No friction rub. No gallop.   Pulmonary:     Effort: Pulmonary effort is normal. No respiratory distress.     Breath sounds: Normal breath sounds. No wheezing.  Abdominal:     General: Bowel sounds are normal. There is no distension or abdominal bruit.     Palpations: Abdomen is soft. There is no hepatomegaly or splenomegaly.  Tenderness: There is no abdominal tenderness. There is no right CVA tenderness or left CVA tenderness.     Hernia: No hernia is present.  Musculoskeletal: Normal range of motion.     Right lower leg: No edema.     Left lower leg: No edema.  Lymphadenopathy:     Cervical: No cervical adenopathy.  Skin:    General: Skin is warm and dry.     Capillary Refill: Capillary refill takes less than 2 seconds.     Coloration: Skin is not cyanotic, jaundiced or pale.     Findings: No rash.  Neurological:     General: No focal deficit present.     Mental Status: She is alert and oriented to person, place, and time.     Cranial Nerves: Cranial nerves are intact. No cranial nerve deficit.     Sensory: Sensation is intact. No sensory deficit.     Motor: Motor function is intact. No weakness.     Coordination: Coordination is intact. Coordination normal.     Gait: Gait is intact. Gait normal.     Deep Tendon Reflexes: Reflexes are normal and symmetric. Reflexes normal.  Psychiatric:        Attention and Perception: Attention and perception normal.        Mood and Affect: Mood and affect normal.        Speech: Speech normal.        Behavior: Behavior normal. Behavior is cooperative.        Thought Content: Thought content normal.        Cognition and Memory: Cognition and memory normal.        Judgment: Judgment normal.     Results for orders placed or performed in visit on 11/15/18   CMP14+EGFR  Result Value Ref Range   Glucose 131 (H) 65 - 99 mg/dL   BUN 9 6 - 20 mg/dL   Creatinine, Ser 0.77 0.57 - 1.00 mg/dL   GFR calc non Af Amer 108 >59 mL/min/1.73   GFR calc Af Amer 124 >59 mL/min/1.73   BUN/Creatinine Ratio 12 9 - 23   Sodium 141 134 - 144 mmol/L   Potassium 3.9 3.5 - 5.2 mmol/L   Chloride 105 96 - 106 mmol/L   CO2 21 20 - 29 mmol/L   Calcium 9.3 8.7 - 10.2 mg/dL   Total Protein 6.9 6.0 - 8.5 g/dL   Albumin 4.1 3.9 - 5.0 g/dL   Globulin, Total 2.8 1.5 - 4.5 g/dL   Albumin/Globulin Ratio 1.5 1.2 - 2.2   Bilirubin Total 0.2 0.0 - 1.2 mg/dL   Alkaline Phosphatase 63 39 - 117 IU/L   AST 13 0 - 40 IU/L   ALT 10 0 - 32 IU/L  CBC with Differential/Platelet  Result Value Ref Range   WBC 8.4 3.4 - 10.8 x10E3/uL   RBC 4.87 3.77 - 5.28 x10E6/uL   Hemoglobin 13.9 11.1 - 15.9 g/dL   Hematocrit 41.9 34.0 - 46.6 %   MCV 86 79 - 97 fL   MCH 28.5 26.6 - 33.0 pg   MCHC 33.2 31.5 - 35.7 g/dL   RDW 12.2 11.7 - 15.4 %   Platelets 366 150 - 450 x10E3/uL   Neutrophils 53 Not Estab. %   Lymphs 37 Not Estab. %   Monocytes 6 Not Estab. %   Eos 3 Not Estab. %   Basos 1 Not Estab. %   Neutrophils Absolute 4.5 1.4 - 7.0 x10E3/uL   Lymphocytes Absolute  3.1 0.7 - 3.1 x10E3/uL   Monocytes Absolute 0.5 0.1 - 0.9 x10E3/uL   EOS (ABSOLUTE) 0.2 0.0 - 0.4 x10E3/uL   Basophils Absolute 0.0 0.0 - 0.2 x10E3/uL   Immature Granulocytes 0 Not Estab. %   Immature Grans (Abs) 0.0 0.0 - 0.1 x10E3/uL  TSH  Result Value Ref Range   TSH 1.470 0.450 - 4.500 uIU/mL  Bayer DCA Hb A1c Waived  Result Value Ref Range   HB A1C (BAYER DCA - WAIVED) 5.4 <7.0 %  VITAMIN D 25 Hydroxy (Vit-D Deficiency, Fractures)  Result Value Ref Range   Vit D, 25-Hydroxy 36.7 30.0 - 100.0 ng/mL  Vitamin B12  Result Value Ref Range   Vitamin B-12 220 (L) 232 - 1,245 pg/mL  Lipid panel  Result Value Ref Range   Cholesterol, Total 232 (H) 100 - 199 mg/dL   Triglycerides 132 0 - 149 mg/dL   HDL 54 >39 mg/dL    VLDL Cholesterol Cal 26 5 - 40 mg/dL   LDL Calculated 152 (H) 0 - 99 mg/dL   Chol/HDL Ratio 4.3 0.0 - 4.4 ratio  Glucose Hemocue Waived  Result Value Ref Range   Glu Hemocue Waived 138 (H) 65 - 99 mg/dL       Pertinent labs & imaging results that were available during my care of the patient were reviewed by me and considered in my medical decision making.  Assessment & Plan:  Heidi Gibbs was seen today for dizziness.  Diagnoses and all orders for this visit:  Dizziness Greatly improved. No new or worsening symptoms. Maybe happens once per week if she has not had anything to eat. Continue to monitor symptoms and report anything new or worsening.     Continue all other maintenance medications.  Follow up plan: Return in about 3 months (around 03/16/2019), or if symptoms worsen or fail to improve, for cholesterol.    The above assessment and management plan was discussed with the patient. The patient verbalized understanding of and has agreed to the management plan. Patient is aware to call the clinic if symptoms persist or worsen. Patient is aware when to return to the clinic for a follow-up visit. Patient educated on when it is appropriate to go to the emergency department.   Monia Pouch, FNP-C [facility] [phone]

## 2019-03-19 ENCOUNTER — Ambulatory Visit: Payer: BC Managed Care – PPO | Admitting: Family Medicine

## 2019-04-16 ENCOUNTER — Other Ambulatory Visit: Payer: Self-pay | Admitting: Family Medicine

## 2019-04-16 DIAGNOSIS — G479 Sleep disorder, unspecified: Secondary | ICD-10-CM

## 2019-04-16 DIAGNOSIS — I10 Essential (primary) hypertension: Secondary | ICD-10-CM

## 2019-06-18 ENCOUNTER — Encounter: Payer: Self-pay | Admitting: Family Medicine

## 2019-06-18 ENCOUNTER — Ambulatory Visit (INDEPENDENT_AMBULATORY_CARE_PROVIDER_SITE_OTHER): Payer: BC Managed Care – PPO | Admitting: Family Medicine

## 2019-06-18 DIAGNOSIS — J069 Acute upper respiratory infection, unspecified: Secondary | ICD-10-CM | POA: Diagnosis not present

## 2019-06-18 MED ORDER — HYDROCODONE-HOMATROPINE 5-1.5 MG/5ML PO SYRP: 5.0000 mL | ORAL_SOLUTION | Freq: Four times a day (QID) | ORAL | 0 refills | Status: DC | PRN

## 2019-06-18 MED ORDER — PREDNISONE 10 MG (21) PO TBPK: ORAL_TABLET | ORAL | 0 refills | Status: DC

## 2019-06-18 NOTE — Progress Notes (Signed)
Virtual Visit via Telephone Note  I connected with Heidi Gibbs on 06/18/19 at 2:23 PM by telephone and verified that I am speaking with the correct person using two identifiers. Heidi Gibbs is currently located at home and nobody is currently with her during this visit. The provider, Loman Brooklyn, FNP is located in their home at time of visit.  I discussed the limitations, risks, security and privacy concerns of performing an evaluation and management service by telephone and the availability of in person appointments. I also discussed with the patient that there may be a patient responsible charge related to this service. The patient expressed understanding and agreed to proceed.  Subjective: PCP: Baruch Gouty, FNP  Chief Complaint  Patient presents with  . Cough   Patient complains of cough. Additional symptoms include head/chest congestion, postnasal drainage, nausea and dizziness. Onset of symptoms was 5 days ago, unchanged since that time. She is drinking plenty of fluids. Evaluation to date: [facility] 3 days ago where she was told it was allergies. Treatment to date: Hycodan, Mucinex, and Claritin. She does not have a history of allergies, asthma or COPD. She does not smoke.    ROS: Per HPI  Current Outpatient Medications:  .  acetaZOLAMIDE (DIAMOX) 500 MG capsule, Take 500 mg by mouth 2 (two) times daily., Disp: , Rfl:  .  Ascorbic Acid (VITAMIN C CR) 500 MG CPCR, Vitamin C 500 mg capsule,extended release  Take by oral route., Disp: , Rfl:  .  JOLESSA 0.15-0.03 MG tablet, Take 1 tablet by mouth daily., Disp: , Rfl:  .  metoprolol succinate (TOPROL-XL) 25 MG 24 hr tablet, TAKE 1 TABLET DAILY, Disp: 90 tablet, Rfl: 3 .  traZODone (DESYREL) 100 MG tablet, TAKE ONE-HALF (1/2) TABLET AT BEDTIME AS NEEDED FOR SLEEP, Disp: 45 tablet, Rfl: 3  Allergies  Allergen Reactions  . Erythromycin Hives  . Penicillins Nausea And Vomiting    Has patient had a PCN  reaction causing immediate rash, facial/tongue/throat swelling, SOB or lightheadedness with hypotension: No Has patient had a PCN reaction causing severe rash involving mucus membranes or skin necrosis: No Has patient had a PCN reaction that required hospitalization No Has patient had a PCN reaction occurring within the last 10 years: Yes If all of the above answers are "NO", then may proceed with Cephalosporin use.   . Tamiflu [Oseltamivir Phosphate] Nausea And Vomiting  . Clindamycin Hives  . Latex   . Oseltamivir Nausea And Vomiting   Past Medical History:  Diagnosis Date  . Brain tumor (benign) ([place])   . DDD (degenerative disc disease), lumbar   . Generalized anxiety disorder   . Insomnia   . Migraines     Observations/Objective: A&O  No respiratory distress or wheezing audible over the phone Mood, judgement, and thought processes all WNL   Assessment and Plan: 1. Upper respiratory tract infection, unspecified type - Encouraged patient to go get tested for COVID-19.  Symptom management discussed.  Education provided on COVID-19. - HYDROcodone-homatropine (HYCODAN) 5-1.5 MG/5ML syrup; Take 5 mLs by mouth every 6 (six) hours as needed for cough.  Dispense: 120 mL; Refill: 0 - predniSONE (STERAPRED UNI-PAK 21 TAB) 10 MG (21) TBPK tablet; Use as directed on back of pill pack  Dispense: 21 tablet; Refill: 0   Follow Up Instructions:  I discussed the assessment and treatment plan with the patient. The patient was provided an opportunity to ask questions and all were answered. The patient agreed  with the plan and demonstrated an understanding of the instructions.   The patient was advised to call back or seek an in-person evaluation if the symptoms worsen or if the condition fails to improve as anticipated.  The above assessment and management plan was discussed with the patient. The patient verbalized understanding of and has agreed to the management plan. Patient is aware to  call the clinic if symptoms persist or worsen. Patient is aware when to return to the clinic for a follow-up visit. Patient educated on when it is appropriate to go to the emergency department.   Time call ended: 2:32 PM  I provided 13 minutes of non-face-to-face time during this encounter.  Hendricks Limes, MSN, APRN, FNP-C [facility] 06/18/19

## 2019-06-18 NOTE — Patient Instructions (Signed)
This information is directly available on the CDC website: https://www.cdc.gov/coronavirus/2019-ncov/if-you-are-sick/steps-when-sick.html    Source:CDC Reference to specific commercial products, manufacturers, companies, or trademarks does not constitute its endorsement or recommendation by the U.S. Government, Department of Health and Human Services, or Centers for Disease Control and Prevention.  

## 2019-08-31 DIAGNOSIS — F5101 Primary insomnia: Secondary | ICD-10-CM | POA: Insufficient documentation

## 2019-08-31 DIAGNOSIS — M797 Fibromyalgia: Secondary | ICD-10-CM | POA: Insufficient documentation

## 2019-09-09 ENCOUNTER — Ambulatory Visit: Payer: BC Managed Care – PPO | Attending: Internal Medicine

## 2019-09-09 DIAGNOSIS — Z23 Encounter for immunization: Secondary | ICD-10-CM | POA: Insufficient documentation

## 2019-09-09 NOTE — Progress Notes (Signed)
   Covid-19 Vaccination Clinic  Name:  Heidi Gibbs    MRN: [id number] DOB: 1990/10/23  09/09/2019  [name] was observed post Covid-19 immunization for 15 minutes without incident. She was provided with Vaccine Information Sheet and instruction to access the V-Safe system.   [name] was instructed to call 911 with any severe reactions post vaccine: Marland Kitchen Difficulty breathing  . Swelling of face and throat  . A fast heartbeat  . A bad rash all over body  . Dizziness and weakness   Immunizations Administered    Name Date Dose VIS Date Route   Pfizer COVID-19 Vaccine 09/09/2019 10:13 AM 0.3 mL 06/15/2019 Intramuscular   Manufacturer: [place]   Lot: [id number]   [place]: [id number]

## 2019-09-30 ENCOUNTER — Ambulatory Visit: Payer: BC Managed Care – PPO | Attending: Internal Medicine

## 2019-09-30 DIAGNOSIS — Z23 Encounter for immunization: Secondary | ICD-10-CM

## 2019-09-30 NOTE — Progress Notes (Signed)
   Covid-19 Vaccination Clinic  Name:  Heidi Gibbs    MRN: [id number] DOB: 01/15/1991  09/30/2019  [name] was observed post Covid-19 immunization for 15 minutes without incident. She was provided with Vaccine Information Sheet and instruction to access the V-Safe system.   [name] was instructed to call 911 with any severe reactions post vaccine: Marland Kitchen Difficulty breathing  . Swelling of face and throat  . A fast heartbeat  . A bad rash all over body  . Dizziness and weakness   Immunizations Administered    Name Date Dose VIS Date Route   Pfizer COVID-19 Vaccine 09/30/2019  9:43 AM 0.3 mL 06/15/2019 Intramuscular   Manufacturer: [place]   Lot: [id number]   [place]: [id number]

## 2020-01-02 DIAGNOSIS — F411 Generalized anxiety disorder: Secondary | ICD-10-CM | POA: Insufficient documentation

## 2020-01-02 DIAGNOSIS — F410 Panic disorder [episodic paroxysmal anxiety] without agoraphobia: Secondary | ICD-10-CM | POA: Insufficient documentation

## 2020-03-17 DIAGNOSIS — B349 Viral infection, unspecified: Secondary | ICD-10-CM | POA: Diagnosis not present

## 2020-03-17 DIAGNOSIS — R509 Fever, unspecified: Secondary | ICD-10-CM | POA: Diagnosis not present

## 2020-03-17 DIAGNOSIS — R197 Diarrhea, unspecified: Secondary | ICD-10-CM | POA: Diagnosis not present

## 2020-04-03 DIAGNOSIS — F411 Generalized anxiety disorder: Secondary | ICD-10-CM | POA: Diagnosis not present

## 2020-04-03 DIAGNOSIS — F331 Major depressive disorder, recurrent, moderate: Secondary | ICD-10-CM | POA: Diagnosis not present

## 2020-04-15 DIAGNOSIS — Z6834 Body mass index (BMI) 34.0-34.9, adult: Secondary | ICD-10-CM | POA: Diagnosis not present

## 2020-04-15 DIAGNOSIS — N925 Other specified irregular menstruation: Secondary | ICD-10-CM | POA: Diagnosis not present

## 2020-04-24 DIAGNOSIS — M5442 Lumbago with sciatica, left side: Secondary | ICD-10-CM | POA: Diagnosis not present

## 2020-04-24 DIAGNOSIS — M5441 Lumbago with sciatica, right side: Secondary | ICD-10-CM | POA: Insufficient documentation

## 2020-04-24 DIAGNOSIS — Z9889 Other specified postprocedural states: Secondary | ICD-10-CM | POA: Diagnosis not present

## 2020-04-24 DIAGNOSIS — M5136 Other intervertebral disc degeneration, lumbar region: Secondary | ICD-10-CM | POA: Diagnosis not present

## 2020-05-06 DIAGNOSIS — R102 Pelvic and perineal pain: Secondary | ICD-10-CM | POA: Diagnosis not present

## 2020-05-19 ENCOUNTER — Ambulatory Visit
Admission: RE | Admit: 2020-05-19 | Discharge: 2020-05-19 | Disposition: A | Discharge status: Home / Self Care | Payer: BC Managed Care – PPO | Source: Ambulatory Visit

## 2020-05-19 VITALS — BP 115/78 | HR 91 | Temp 98.7°F | Resp 18 | Ht 70.0 in | Wt 235.0 lb

## 2020-05-19 DIAGNOSIS — J0100 Acute maxillary sinusitis, unspecified: Secondary | ICD-10-CM

## 2020-05-19 MED ORDER — DOXYCYCLINE HYCLATE 100 MG PO CAPS: 100.0000 mg | ORAL_CAPSULE | Freq: Two times a day (BID) | ORAL | 0 refills | Status: AC

## 2020-05-19 NOTE — ED Triage Notes (Signed)
Pt reports having nasal and chest congestion that began on Thursday. Also reports having a headache. No known covid exposure.

## 2020-05-19 NOTE — ED Provider Notes (Signed)
[facility]    CSN: [id number] Arrival date & time: 05/19/20  1308      History   Chief Complaint Chief Complaint  Patient presents with  . Appointment  . Nasal Congestion    HPI Heidi Gibbs is a 29 y.o. female.   Patient presents with 7-day history of nasal congestion, sinus pressure, rhinorrhea.  She also reports a sinus headache and intermittent fever/chills.   She denies rash, sore throat, cough, shortness of breath, vomiting, diarrhea, or other symptoms.  OTC treatment attempted at home.  Her medical history includes hypertension, benign brain tumor, migraines, fibromyalgia, anxiety, insomnia, GERD, DDD, obesity.  The history is provided by the patient and medical records.    Past Medical History:  Diagnosis Date  . Brain tumor (benign) ([place])   . DDD (degenerative disc disease), lumbar   . Generalized anxiety disorder   . Insomnia   . Migraines     Patient Active Problem List   Diagnosis Date Noted  . Mixed hyperlipidemia 11/15/2018  . Essential hypertension 04/21/2018  . Idiopathic intracranial hypertension 04/21/2018  . BMI 35.0-35.9,adult 04/21/2018  . Primary fibromyalgia syndrome 10/20/2016  . S/P spinal surgery 07/23/2016  . Depression, major, single episode, mild ([place]) 06/07/2016  . GERD (gastroesophageal reflux disease) 06/07/2016  . Obesity 06/07/2016  . HNP (herniated nucleus pulposus), lumbar 12/09/2015  . Sleeping difficulty 07/29/2015  . Migraine without status migrainosus, not intractable 07/29/2015  . Generalized anxiety disorder 01/28/2015  . Cardiac murmur, unspecified 03/29/2014    Past Surgical History:  Procedure Laterality Date  . BACK SURGERY    . LUMBAR DISC SURGERY  06/21/2016  . WISDOM TOOTH EXTRACTION      OB History    Gravida  0   Para  0   Term  0   Preterm  0   AB  0   Living  0     SAB  0   TAB  0   Ectopic  0   Multiple  0   Live Births  0            Home Medications     Prior to Admission medications   Medication Sig Start Date End Date Taking? Authorizing Provider  sertraline (ZOLOFT) 25 MG tablet Take 75 mg by mouth daily.   Yes [provider]  acetaZOLAMIDE (DIAMOX) 500 MG capsule Take 500 mg by mouth 2 (two) times daily.    [provider]  Ascorbic Acid (VITAMIN C CR) 500 MG CPCR Vitamin C 500 mg capsule,extended release  Take by oral route.    [provider]  doxycycline (VIBRAMYCIN) 100 MG capsule Take 1 capsule (100 mg total) by mouth 2 (two) times daily for 7 days. 05/19/20 05/26/20  Sharion Balloon, NP  HYDROcodone-homatropine (HYCODAN) 5-1.5 MG/5ML syrup Take 5 mLs by mouth every 6 (six) hours as needed for cough. 06/18/19   Loman Brooklyn, FNP  JOLESSA 0.15-0.03 MG tablet Take 1 tablet by mouth daily. 08/25/15   [provider]  metoprolol succinate (TOPROL-XL) 25 MG 24 hr tablet TAKE 1 TABLET DAILY 04/16/19   Rakes, Connye Burkitt, FNP  predniSONE (STERAPRED UNI-PAK 21 TAB) 10 MG (21) TBPK tablet Use as directed on back of pill pack 06/18/19   Hendricks Limes F, FNP  traZODone (DESYREL) 100 MG tablet TAKE ONE-HALF (1/2) TABLET AT BEDTIME AS NEEDED FOR SLEEP 04/16/19   Rakes, Connye Burkitt, FNP    Family History Family History  Problem Relation  Age of Onset  . Diabetes Mother   . Hypertension Mother     Social History Social History   Tobacco Use  . Smoking status: Never Smoker  . Smokeless tobacco: Never Used  Vaping Use  . Vaping Use: Never used  Substance Use Topics  . Alcohol use: No  . Drug use: No     Allergies   Erythromycin, Penicillins, Tamiflu [oseltamivir phosphate], Clindamycin, Latex, and Oseltamivir   Review of Systems Review of Systems  Constitutional: Positive for chills and fever.  HENT: Positive for congestion, rhinorrhea and sinus pressure. Negative for ear pain and sore throat.   Eyes: Negative for pain and visual disturbance.  Respiratory: Negative for cough and shortness of  breath.   Cardiovascular: Negative for chest pain and palpitations.  Gastrointestinal: Negative for abdominal pain and vomiting.  Genitourinary: Negative for dysuria and hematuria.  Musculoskeletal: Negative for arthralgias and back pain.  Skin: Negative for color change and rash.  Neurological: Positive for headaches. Negative for seizures and syncope.  All other systems reviewed and are negative.    Physical Exam Triage Vital Signs ED Triage Vitals  Enc Vitals Group     BP      Pulse      Resp      Temp      Temp src      SpO2      Weight      Height      Head Circumference      Peak Flow      Pain Score      Pain Loc      Pain Edu?      Excl. in [place]?    No data found.  Updated Vital Signs BP 115/78   Pulse 91   Temp 98.7 F (37.1 C) (Oral)   Resp 18   Ht 5\' 10"  (1.778 m)   Wt 235 lb (106.6 kg)   SpO2 98%   BMI 33.72 kg/m   Visual Acuity Right Eye Distance:   Left Eye Distance:   Bilateral Distance:    Right Eye Near:   Left Eye Near:    Bilateral Near:     Physical Exam   UC Treatments / Results  Labs (all labs ordered are listed, but only abnormal results are displayed) Labs Reviewed  NOVEL CORONAVIRUS, NAA    EKG   Radiology No results found.  Procedures Procedures (including critical care time)  Medications Ordered in UC Medications - No data to display  Initial Impression / Assessment and Plan / UC Course  I have reviewed the triage vital signs and the nursing notes.  Pertinent labs & imaging results that were available during my care of the patient were reviewed by me and considered in my medical decision making (see chart for details).   Acute sinusitis.  Treating with doxycycline.  Discussed other symptomatic treatment including Tylenol, rest, hydration.  PCR COVID pending.  Instructed patient to self quarantine until test result is back.  Instructed her to follow-up with her PCP if her symptoms are not improving.  Patient  agrees to plan of care.   Final Clinical Impressions(s) / UC Diagnoses   Final diagnoses:  Acute non-recurrent maxillary sinusitis     Discharge Instructions     Take the antibiotic as directed.    Your COVID test is pending.  You should self quarantine until the test result is back.    Take Tylenol as needed for fever or discomfort.  Rest and keep yourself hydrated.    Follow up with your primary care provider if your symptoms are not improving.       ED Prescriptions    Medication Sig Dispense Auth. Provider   doxycycline (VIBRAMYCIN) 100 MG capsule Take 1 capsule (100 mg total) by mouth 2 (two) times daily for 7 days. 14 capsule Sharion Balloon, NP     PDMP not reviewed this encounter.   Sharion Balloon, NP 05/19/20 1425

## 2020-05-19 NOTE — Discharge Instructions (Addendum)
Take the antibiotic as directed.    Your COVID test is pending.  You should self quarantine until the test result is back.    Take Tylenol as needed for fever or discomfort.  Rest and keep yourself hydrated.    Follow up with your primary care provider if your symptoms are not improving.

## 2020-05-20 LAB — SARS-COV-2, NAA 2 DAY TAT

## 2020-05-20 LAB — NOVEL CORONAVIRUS, NAA: SARS-CoV-2, NAA: NOT DETECTED

## 2020-06-08 DIAGNOSIS — R509 Fever, unspecified: Secondary | ICD-10-CM | POA: Diagnosis not present

## 2020-06-08 DIAGNOSIS — Z20822 Contact with and (suspected) exposure to covid-19: Secondary | ICD-10-CM | POA: Diagnosis not present

## 2020-10-20 ENCOUNTER — Ambulatory Visit: Payer: Self-pay | Admitting: Family Medicine

## 2021-03-04 DIAGNOSIS — Z3490 Encounter for supervision of normal pregnancy, unspecified, unspecified trimester: Secondary | ICD-10-CM | POA: Diagnosis not present

## 2021-03-04 DIAGNOSIS — Z113 Encounter for screening for infections with a predominantly sexual mode of transmission: Secondary | ICD-10-CM | POA: Diagnosis not present

## 2021-03-23 DIAGNOSIS — J020 Streptococcal pharyngitis: Secondary | ICD-10-CM | POA: Diagnosis not present

## 2021-03-23 DIAGNOSIS — Z03818 Encounter for observation for suspected exposure to other biological agents ruled out: Secondary | ICD-10-CM | POA: Diagnosis not present

## 2021-03-26 DIAGNOSIS — Z3480 Encounter for supervision of other normal pregnancy, unspecified trimester: Secondary | ICD-10-CM | POA: Diagnosis not present

## 2021-03-26 LAB — HM PAP SMEAR: HM Pap smear: NORMAL

## 2021-03-26 LAB — OB RESULTS CONSOLE RUBELLA ANTIBODY, IGM: Rubella: IMMUNE

## 2021-03-26 LAB — OB RESULTS CONSOLE GC/CHLAMYDIA
Chlamydia: NEGATIVE
Gonorrhea: NEGATIVE

## 2021-03-26 LAB — OB RESULTS CONSOLE HEPATITIS B SURFACE ANTIGEN: Hepatitis B Surface Ag: NEGATIVE

## 2021-03-26 LAB — OB RESULTS CONSOLE HIV ANTIBODY (ROUTINE TESTING): HIV: NONREACTIVE

## 2021-03-26 LAB — HEPATITIS C ANTIBODY: HCV Ab: NEGATIVE

## 2021-03-26 LAB — OB RESULTS CONSOLE ABO/RH: RH Type: POSITIVE

## 2021-03-26 LAB — OB RESULTS CONSOLE ANTIBODY SCREEN: Antibody Screen: NEGATIVE

## 2021-03-26 LAB — OB RESULTS CONSOLE RPR: RPR: NONREACTIVE

## 2021-03-31 ENCOUNTER — Encounter: Payer: Self-pay | Admitting: *Deleted

## 2021-04-04 DIAGNOSIS — Z419 Encounter for procedure for purposes other than remedying health state, unspecified: Secondary | ICD-10-CM | POA: Diagnosis not present

## 2021-05-05 DIAGNOSIS — Z419 Encounter for procedure for purposes other than remedying health state, unspecified: Secondary | ICD-10-CM | POA: Diagnosis not present

## 2021-05-07 DIAGNOSIS — Z363 Encounter for antenatal screening for malformations: Secondary | ICD-10-CM | POA: Diagnosis not present

## 2021-05-07 DIAGNOSIS — Z3A19 19 weeks gestation of pregnancy: Secondary | ICD-10-CM | POA: Diagnosis not present

## 2021-05-08 ENCOUNTER — Encounter: Payer: Self-pay | Admitting: Family Medicine

## 2021-05-21 DIAGNOSIS — Z369 Encounter for antenatal screening, unspecified: Secondary | ICD-10-CM | POA: Diagnosis not present

## 2021-05-21 DIAGNOSIS — Z3A21 21 weeks gestation of pregnancy: Secondary | ICD-10-CM | POA: Diagnosis not present

## 2021-05-27 ENCOUNTER — Encounter: Payer: Self-pay | Admitting: Physical Therapy

## 2021-05-27 ENCOUNTER — Other Ambulatory Visit: Payer: Self-pay

## 2021-05-27 ENCOUNTER — Ambulatory Visit: Payer: Medicaid Other | Attending: Obstetrics | Admitting: Physical Therapy

## 2021-05-27 DIAGNOSIS — M545 Low back pain, unspecified: Secondary | ICD-10-CM | POA: Insufficient documentation

## 2021-05-27 DIAGNOSIS — M62830 Muscle spasm of back: Secondary | ICD-10-CM | POA: Diagnosis not present

## 2021-05-27 NOTE — Therapy (Signed)
[facility] @ [address] [place], [place], [address] Phone: [phone]   Fax:  [phone]  Physical Therapy Evaluation  Patient Details  Name: Heidi Gibbs MRN: [id number] Date of Birth: June 12, 1991 Referring Provider (PT): Dr. Debbora Dus   Encounter Date: 05/27/2021   PT End of Session - 05/27/21 1108     Visit Number 1    Date for PT Re-Evaluation 07/22/21    Authorization Type [employer]    PT Start Time 1100    PT Stop Time 1138    PT Time Calculation (min) 38 min    Activity Tolerance Patient tolerated treatment well;No increased pain    Behavior During Therapy WFL for tasks assessed/performed             Past Medical History:  Diagnosis Date   Brain tumor (benign) ([place])    DDD (degenerative disc disease), lumbar    Generalized anxiety disorder    Insomnia    Migraines     Past Surgical History:  Procedure Laterality Date   BACK SURGERY     LUMBAR [place] SURGERY  06/21/2016   WISDOM TOOTH EXTRACTION      There were no vitals filed for this visit.    Subjective Assessment - 05/27/21 1110     Subjective Patient reports she had a little bit of back pain. She has degenrative Disc disease in spine. She had disectomy at L4 and L5. Patient pain started on 05/21/2021 with sudden onset. Pain was gradually worse over the weekend.    Patient Stated Goals reduce pain and move around    Currently in Pain? Yes    Pain Score 4     Pain Location Back    Pain Orientation Lower    Pain Descriptors / Indicators Sharp    Pain Type Acute pain    Pain Onset More than a month ago    Pain Frequency Constant    Aggravating Factors  sit to stand, laying down to sitting    Pain Relieving Factors ice packs, sleeping on side with maternity pillow    Multiple Pain Sites No                [facility] PT Assessment - 05/27/21 0001       Assessment   Medical Diagnosis O26.899 Other specified pregnancy  related conditions, unspecified trimester    Referring Provider (PT) Dr. Debbora Dus    Onset Date/Surgical Date 05/21/21    Prior Therapy in the past had therapy  2017      Precautions   Precautions Other (comment)    Precaution Comments pregnancy   due date 09/26/2019     Restrictions   Weight Bearing Restrictions No      Balance Screen   Has the patient fallen in the past 6 months No    Has the patient had a decrease in activity level because of a fear of falling?  No    Is the patient reluctant to leave their home because of a fear of falling?  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    Leisure walk      Cognition   Overall Cognitive Status Within Functional Limits for tasks assessed  Observation/Other Assessments   Focus on Therapeutic Outcomes (FOTO)  ODI 21%      Posture/Postural Control   Posture/Postural Control Postural limitations    Postural Limitations Flexed trunk;Decreased lumbar lordosis      ROM / Strength   AROM / PROM / Strength AROM;PROM;Strength      AROM   Lumbar Extension unable to get into neutral    Lumbar - Right Side Bend decreased by 50%      PROM   Overall PROM Comments lays on back with hips flexed to 10 degrees      Strength   Right Hip Flexion 4/5    Right Hip Extension 3-/5    Right Hip ABduction 4-/5    Left Hip Flexion 4/5    Left Hip Extension 3-/5    Left Hip ABduction 4-/5      Palpation   Spinal mobility Decreased movement of L3-S1    SI assessment  right ilium is anteriorly rotated; decreased movement of right SI joint    Palpation comment tenderness located in bilateral quadratus and lumbar paraspinals; tightness in the right SI joint, along the gluteal                        Objective measurements completed on examination:  See above findings.       [facility] Adult PT Treatment/Exercise - 05/27/21 0001       Manual Therapy   Manual Therapy Joint mobilization;Soft tissue mobilization;Taping    Joint Mobilization PA and rotational mobilization to L1-S1; posterior glide of bilateral SI joint    Soft tissue mobilization using the addaday to the lumbar paraspinals, right gluteus medius, buttocks and hamstring    Kinesiotex Inhibit Muscle;Facilitate Muscle      Kinesiotix   Inhibit Muscle  SI joint taping with one across the lumbar sacral area and other two up and down sides of lumbar    Facilitate Muscle  one under her abdomen then one on each side of abdomen going up and down                     PT Education - 05/27/21 1149     Education Details let patient know she can take the tape off in 2-3 days; sent her you tube videos to tape herself    Person(s) Educated Patient    Methods Explanation    Comprehension Verbalized understanding              PT Short Term Goals - 05/27/21 1150       PT SHORT TERM GOAL #1   Title independent with initial HEP    Baseline not educated yet    Time 4    Period Weeks    Status New    Target Date 06/24/21               PT Long Term Goals - 05/27/21 1159       PT LONG TERM GOAL #1   Title independent with advanced HEP to improve lumbar mobility and strength    Baseline not educated yet    Time 8    Period Weeks    Status New    Target Date 07/22/21      PT LONG TERM GOAL #2   Title lumbar extension improved so she is able to extend her back past nuetral due to increased lumbar vertebrae mobility    Baseline unable to get to nuetral  Time 8    Period Weeks    Status New    Target Date 07/22/21      PT LONG TERM GOAL #3   Title lay down in supine  with hip extension at 0 degrees due to decreased back pain    Baseline lay with hips at -10 degrees    Time 8    Period Weeks    Status New    Target Date 07/22/21      PT LONG TERM  GOAL #4   Title pain decreased </= 2/10 when she is going from sitting to standing and getting out of bed    Baseline pain level 4-8/10    Time 8    Period Weeks    Status New    Target Date 07/22/21                    Plan - 05/27/21 1243     Clinical Impression Statement Patient is a 30 year old female with back pain that happened suddenly on 05/21/2021. Her back pain has become increasingly worse. Her pain level is 4/10 but can get to 8/10. Her pain is worse with sit to stand and getting out of bed. Patient is not able to get to neutral when extending her lumbar spine due to decreased mobility and pain. Her right sidebending decreased by 25%. She has decreased movement of L1-S1. Patient has decreased movement of right SI joint and it is rotated anteriorly. When she is laying supine her hips are at -10 degrees. Patient stands with flexed hips, flat lumbar and bilateral hamstrings constantly firing. She has tenderness located in lumbar paraspinals. She has tightness in the right gluteus medius and hamstring. Patient is [redacted] weeks pregnant. She is due on 09/25/2021. Her Oswestry Disablility Index is 21 points. Patient will benefit from skilled therapy to reduce her pain, improve her mobilty and educate on proper body mechanics.    Personal Factors and Comorbidities Comorbidity 2    Comorbidities lumbar disectomy; presently pregnant and due ton 09/25/2021.    Examination-Activity Limitations Bed Mobility;Transfers;Locomotion Level;Sleep;Lift;Stand;Reach Overhead    Examination-Participation Restrictions Meal Prep;Cleaning;Occupation;Driving    Clinical Decision Making Low    Rehab Potential Poor    PT Frequency Other (comment)   1-2 times per week for a total of 12 visits   PT Duration 8 weeks    PT Treatment/Interventions ADLs/Self Care Home Management;Aquatic Therapy;Cryotherapy;Moist Heat;Therapeutic activities;Therapeutic exercise;Neuromuscular re-education;Patient/family  education;Manual techniques;Taping;Spinal Manipulations    PT Next Visit Plan see if the tape has helped; manual mobilization to lumbar and SI; release of the anterior hip; manual work; core stabilization; body mechanics with daily tasks; see if taping helps    Consulted and Agree with Plan of Care Patient             Patient will benefit from skilled therapeutic intervention in order to improve the following deficits and impairments:  Difficulty walking, Decreased range of motion, Increased fascial restricitons, Decreased activity tolerance, Decreased strength, Decreased mobility, Pain  Visit Diagnosis: Acute bilateral low back pain without sciatica  Muscle spasm of back     Problem List Patient Active Problem List   Diagnosis Date Noted   Mixed hyperlipidemia 11/15/2018   Essential hypertension 04/21/2018   Idiopathic intracranial hypertension 04/21/2018   BMI 35.0-35.9,adult 04/21/2018   Primary fibromyalgia syndrome 10/20/2016   S/P spinal surgery 07/23/2016   Depression, major, single episode, mild ([place]) 06/07/2016   GERD (gastroesophageal reflux disease) 06/07/2016  Obesity 06/07/2016   HNP (herniated nucleus pulposus), lumbar 12/09/2015   Sleeping difficulty 07/29/2015   Migraine without status migrainosus, not intractable 07/29/2015   Generalized anxiety disorder 01/28/2015   Cardiac murmur, unspecified 03/29/2014    Earlie Counts, PT 05/27/21 12:55 PM  [facility] @ [address] [place], [place], [address] Phone: [phone]   Fax:  [phone]  Name: SAIDAH KEMPTON MRN: [id number] Date of Birth: [birth year]

## 2021-06-01 DIAGNOSIS — R300 Dysuria: Secondary | ICD-10-CM | POA: Diagnosis not present

## 2021-06-02 ENCOUNTER — Other Ambulatory Visit: Payer: Self-pay

## 2021-06-02 ENCOUNTER — Encounter: Payer: Self-pay | Admitting: Physical Therapy

## 2021-06-02 ENCOUNTER — Ambulatory Visit: Payer: Medicaid Other | Admitting: Physical Therapy

## 2021-06-02 DIAGNOSIS — M545 Low back pain, unspecified: Secondary | ICD-10-CM

## 2021-06-02 DIAGNOSIS — M62830 Muscle spasm of back: Secondary | ICD-10-CM | POA: Diagnosis not present

## 2021-06-02 NOTE — Patient Instructions (Signed)
Access Code: [email or web address] URL: [email or web address] Date: 06/02/2021 Prepared by: Jari Favre  Exercises Quadruped Alternating Arm Lift - 1 x daily - 7 x weekly - 2 sets - 10 reps Bird Dog - 1 x daily - 7 x weekly - 3 sets - 10 reps Supine Hip Internal and External Rotation - 1 x daily - 7 x weekly - 1 sets - 10 reps - 5 sec hold Supine Figure 4 Piriformis Stretch - 1 x daily - 7 x weekly - 1 sets - 3 reps - 30 sec hold

## 2021-06-02 NOTE — Therapy (Signed)
[facility] @ [address] [place] [place], [place], [address] Phone: [phone]   Fax:  [phone]  Physical Therapy Treatment  Patient Details  Name: Heidi Gibbs MRN: [id number] Date of Birth: [birth year] Referring Provider (PT): Dr. Debbora Dus   Encounter Date: 06/02/2021   PT End of Session - 06/02/21 1527     Visit Number 2    Date for PT Re-Evaluation 07/22/21    Authorization Type [employer]    PT Start Time 8301    PT Stop Time 1526    PT Time Calculation (min) 40 min    Activity Tolerance Patient tolerated treatment well;No increased pain    Behavior During Therapy WFL for tasks assessed/performed             Past Medical History:  Diagnosis Date   Brain tumor (benign) ([place])    DDD (degenerative disc disease), lumbar    Generalized anxiety disorder    Insomnia    Migraines     Past Surgical History:  Procedure Laterality Date   BACK SURGERY     LUMBAR [place] SURGERY  06/21/2016   WISDOM TOOTH EXTRACTION      There were no vitals filed for this visit.   Subjective Assessment - 06/02/21 1451     Subjective Pt states the tape worked really well.  It is the best it has been since being here last time    Currently in Pain? Yes    Pain Score 3     Pain Location Back    Pain Orientation Lower;Mid    Pain Descriptors / Indicators Sharp    Pain Type Acute pain    Pain Radiating Towards feels like down to the surgery spot    Pain Onset More than a month ago    Pain Frequency Constant    Pain Relieving Factors ice and maternity pillow    Multiple Pain Sites No                               [facility] Adult PT Treatment/Exercise - 06/02/21 0001       Self-Care   Self-Care Other Self-Care Comments    Other Self-Care Comments  tennis ball massage to gluteals against the wall      Exercises   Exercises Lumbar      Lumbar Exercises: Stretches   Figure 4 Stretch 3 reps;20  seconds    Other Lumbar Stretch Exercise hip rotation windshield wiper stretch - 10 sec x 4      Lumbar Exercises: Aerobic   Nustep L1 x 5 min warm up and status update with ice pack at lumbar      Lumbar Exercises: Supine   Dead Bug 10 reps      Lumbar Exercises: Quadruped   Single Arm Raise Right;Left;10 reps    Straight Leg Raise 10 reps      Manual Therapy   Soft tissue mobilization Rt gluteals STM      Kinesiotix   Inhibit Muscle  SI joint taping with one across the lumbar sacral area and other two up and down sides of lumbar    Facilitate Muscle  one under her abdomen then one on each side of abdomen going up and down                     PT Education - 06/02/21 1530  Education Details Access Code: [email or web address]    Person(s) Educated Patient    Methods Explanation;Demonstration;Tactile cues;Verbal cues;Handout    Comprehension Returned demonstration;Verbalized understanding              PT Short Term Goals - 05/27/21 1150       PT SHORT TERM GOAL #1   Title independent with initial HEP    Baseline not educated yet    Time 4    Period Weeks    Status New    Target Date 06/24/21               PT Long Term Goals - 05/27/21 1159       PT LONG TERM GOAL #1   Title independent with advanced HEP to improve lumbar mobility and strength    Baseline not educated yet    Time 8    Period Weeks    Status New    Target Date 07/22/21      PT LONG TERM GOAL #2   Title lumbar extension improved so she is able to extend her back past nuetral due to increased lumbar vertebrae mobility    Baseline unable to get to nuetral    Time 8    Period Weeks    Status New    Target Date 07/22/21      PT LONG TERM GOAL #3   Title lay down in supine  with hip extension at 0 degrees due to decreased back pain    Baseline lay with hips at -10 degrees    Time 8    Period Weeks    Status New    Target Date 07/22/21      PT LONG TERM GOAL #4   Title pain  decreased </= 2/10 when she is going from sitting to standing and getting out of bed    Baseline pain level 4-8/10    Time 8    Period Weeks    Status New    Target Date 07/22/21                   Plan - 06/02/21 1528     Clinical Impression Statement No goals met today was first follow up. Pt was feeling better today that she had since eval.  Pt felt the tape helped.  There was some minor skin irritation but she felt it was worth it to use the tape and it didn't bother her skin that much but pt was informed to monitor and remove if it was getting more irritated.  Pt was able to begin core strength and will benefit from skilled PT to continue to address core and postural strength.    PT Treatment/Interventions ADLs/Self Care Home Management;Aquatic Therapy;Cryotherapy;Moist Heat;Therapeutic activities;Therapeutic exercise;Neuromuscular re-education;Patient/family education;Manual techniques;Taping;Spinal Manipulations    PT Next Visit Plan re-assess tape; progress core strength as tolerated    PT Home Exercise Plan Access Code: [email or web address]    Consulted and Agree with Plan of Care Patient             Patient will benefit from skilled therapeutic intervention in order to improve the following deficits and impairments:  Difficulty walking, Decreased range of motion, Increased fascial restricitons, Decreased activity tolerance, Decreased strength, Decreased mobility, Pain  Visit Diagnosis: Acute bilateral low back pain without sciatica  Muscle spasm of back     Problem List Patient Active Problem List   Diagnosis Date Noted   Mixed hyperlipidemia 11/15/2018   Essential hypertension 04/21/2018  Idiopathic intracranial hypertension 04/21/2018   BMI 35.0-35.9,adult 04/21/2018   Primary fibromyalgia syndrome 10/20/2016   S/P spinal surgery 07/23/2016   Depression, major, single episode, mild ([place]) 06/07/2016   GERD (gastroesophageal reflux disease) 06/07/2016   Obesity  06/07/2016   HNP (herniated nucleus pulposus), lumbar 12/09/2015   Sleeping difficulty 07/29/2015   Migraine without status migrainosus, not intractable 07/29/2015   Generalized anxiety disorder 01/28/2015   Cardiac murmur, unspecified 03/29/2014    Jule Ser, PT 06/02/2021, 3:31 PM  [facility] @ [address] [place], [place], [address] Phone: [phone]   Fax:  [phone]  Name: Heidi Gibbs MRN: [id number] Date of Birth: 25-Feb-1991

## 2021-06-04 DIAGNOSIS — Z419 Encounter for procedure for purposes other than remedying health state, unspecified: Secondary | ICD-10-CM | POA: Diagnosis not present

## 2021-06-08 ENCOUNTER — Other Ambulatory Visit: Payer: Self-pay

## 2021-06-08 ENCOUNTER — Ambulatory Visit: Payer: Medicaid Other | Attending: Obstetrics | Admitting: Physical Therapy

## 2021-06-08 ENCOUNTER — Encounter: Payer: Self-pay | Admitting: Physical Therapy

## 2021-06-08 DIAGNOSIS — M545 Low back pain, unspecified: Secondary | ICD-10-CM | POA: Insufficient documentation

## 2021-06-08 DIAGNOSIS — M62830 Muscle spasm of back: Secondary | ICD-10-CM | POA: Diagnosis not present

## 2021-06-08 NOTE — Therapy (Signed)
[facility] @ [address] [place], [place], [address] Phone: [phone]   Fax:  [phone]  Physical Therapy Treatment  Patient Details  Name: Heidi Gibbs MRN: [id number] Date of Birth: February 11, 1991 Referring Provider (PT): Dr. Debbora Dus   Encounter Date: 06/08/2021   PT End of Session - 06/08/21 1532     Visit Number 3    Date for PT Re-Evaluation 07/22/21    Authorization Type [employer]    PT Start Time 1532    PT Stop Time 1610    PT Time Calculation (min) 38 min    Activity Tolerance Patient tolerated treatment well;No increased pain    Behavior During Therapy WFL for tasks assessed/performed             Past Medical History:  Diagnosis Date   Brain tumor (benign) ([place])    DDD (degenerative disc disease), lumbar    Generalized anxiety disorder    Insomnia    Migraines     Past Surgical History:  Procedure Laterality Date   BACK SURGERY     LUMBAR [place] SURGERY  06/21/2016   WISDOM TOOTH EXTRACTION      There were no vitals filed for this visit.   Subjective Assessment - 06/08/21 1535     Subjective 90% improved back pain. I am going to see the OB Wed to check my lower abdomin pain.    Currently in Pain? No/denies   No back pain.                              [facility] Adult PT Treatment/Exercise - 06/08/21 0001       Lumbar Exercises: Aerobic   Nustep L3 x 45min with PTA present to discuss status      Lumbar Exercises: Supine   Dead Bug 10 reps;3 seconds   Review and performance   Dead Bug Limitations Pt semi reclined      Lumbar Exercises: Quadruped   Madcat/Old Horse 5 reps    Madcat/Old Horse Limitations VC/TC to engage upper back muscles, TC to get spine articulating    Single Arm Raise Left;Right   3x   Straight Leg Raise 5 reps    Straight Leg Raises Limitations TC for pelvic alignment    Other Quadruped Lumbar Exercises Childs pose with pillows,  TC/VC for breathing for labor & dellivery      Kinesiotix   Inhibit Muscle  SI joint taping with one across the lumbar sacral area and other two up and down sides of lumbar                       PT Short Term Goals - 06/08/21 1559       PT SHORT TERM GOAL #1   Title independent with initial HEP    Time 4    Period Weeks    Status Achieved    Target Date 06/24/21               PT Long Term Goals - 06/08/21 1559       PT LONG TERM GOAL #4   Title pain decreased </= 2/10 when she is going from sitting to standing and getting out of bed    Time 8    Period Weeks    Status Achieved   2/10 on average  Plan - 06/08/21 1537     Clinical Impression Statement Pt reports back pain is 90% resolved at this time. She thinks she is having round ligament pain but will see OB this Wed to check out. Pt has significant pelvic torque when performing hip extension in quadruped. PT required TC/ & VC to correct. Pt was educated in how to use childs pose for relaxation, pain control and a rest in between her quadruped exercises. Pt was also educated in how to practice breathing for labor & delivery in childs pose. Pt declined Kiniesio tape to her abdomin as there appars to be some mild skin irritation, but she was ok with taping to her back.    Personal Factors and Comorbidities Comorbidity 2    Comorbidities lumbar disectomy; presently pregnant and due ton 09/25/2021.    Examination-Activity Limitations Bed Mobility;Transfers;Locomotion Level;Sleep;Lift;Stand;Reach Overhead    Examination-Participation Restrictions Meal Prep;Cleaning;Occupation;Driving    Rehab Potential Poor    PT Frequency Other (comment)    PT Duration 8 weeks    PT Treatment/Interventions ADLs/Self Care Home Management;Aquatic Therapy;Cryotherapy;Moist Heat;Therapeutic activities;Therapeutic exercise;Neuromuscular re-education;Patient/family education;Manual techniques;Taping;Spinal  Manipulations    PT Next Visit Plan Continue to work on gentle hip extension AROM and strength, core as well    PT Home Exercise Plan Access Code: [email or web address]    [email or web address] and Agree with Plan of Care Patient             Patient will benefit from skilled therapeutic intervention in order to improve the following deficits and impairments:  Difficulty walking, Decreased range of motion, Increased fascial restricitons, Decreased activity tolerance, Decreased strength, Decreased mobility, Pain  Visit Diagnosis: Acute bilateral low back pain without sciatica  Muscle spasm of back     Problem List Patient Active Problem List   Diagnosis Date Noted   Mixed hyperlipidemia 11/15/2018   Essential hypertension 04/21/2018   Idiopathic intracranial hypertension 04/21/2018   BMI 35.0-35.9,adult 04/21/2018   Primary fibromyalgia syndrome 10/20/2016   S/P spinal surgery 07/23/2016   Depression, major, single episode, mild ([place]) 06/07/2016   GERD (gastroesophageal reflux disease) 06/07/2016   Obesity 06/07/2016   HNP (herniated nucleus pulposus), lumbar 12/09/2015   Sleeping difficulty 07/29/2015   Migraine without status migrainosus, not intractable 07/29/2015   Generalized anxiety disorder 01/28/2015   Cardiac murmur, unspecified 03/29/2014    Anesa Fronek, PTA 06/08/2021, 4:12 PM  [facility] @ [address] [place], [place], [address] Phone: [phone]   Fax:  [phone]  Name: Heidi Gibbs MRN: [id number] Date of Birth: 1990-08-23

## 2021-06-15 ENCOUNTER — Encounter: Payer: Medicaid Other | Admitting: Physical Therapy

## 2021-06-17 ENCOUNTER — Other Ambulatory Visit: Payer: Self-pay

## 2021-06-17 ENCOUNTER — Ambulatory Visit: Payer: Medicaid Other

## 2021-06-17 DIAGNOSIS — M545 Low back pain, unspecified: Secondary | ICD-10-CM

## 2021-06-17 DIAGNOSIS — M62830 Muscle spasm of back: Secondary | ICD-10-CM

## 2021-06-17 NOTE — Therapy (Signed)
[facility] @ [place], [place], [address] Phone: [phone]   Fax:  [phone]  Physical Therapy Treatment  Patient Details  Name: Heidi Gibbs MRN: [id number] Date of Birth: 1990-11-22 Referring Provider (PT): Dr. Debbora Dus   Encounter Date: 06/17/2021   PT End of Session - 06/17/21 1547     Visit Number 4    Date for PT Re-Evaluation 07/22/21    Authorization Type [employer]    PT Start Time 1528    PT Stop Time 1606    PT Time Calculation (min) 38 min    Activity Tolerance Patient tolerated treatment well;No increased pain    Behavior During Therapy WFL for tasks assessed/performed             Past Medical History:  Diagnosis Date   Brain tumor (benign) ([place])    DDD (degenerative disc disease), lumbar    Generalized anxiety disorder    Insomnia    Migraines     Past Surgical History:  Procedure Laterality Date   BACK SURGERY     LUMBAR [place] SURGERY  06/21/2016   WISDOM TOOTH EXTRACTION      There were no vitals filed for this visit.   Subjective Assessment - 06/17/21 1532     Subjective Patient reports back pain is "in the middle" today.  She is having more of the round ligament pain.  She rates her pain at 4/10.  Due date is in March.    Patient Stated Goals reduce pain and move around    Pain Score 4     Pain Location Back    Pain Orientation Medial    Pain Descriptors / Indicators Aching    Pain Type Acute pain    Pain Onset More than a month ago    Pain Frequency Constant                               [facility] Adult PT Treatment/Exercise - 06/17/21 0001       Exercises   Exercises Lumbar      Lumbar Exercises: Aerobic   Nustep L3 x 46min with PTA present to discuss status      Lumbar Exercises: Supine   Clam 20 reps    Heel Slides 10 reps    Heel Slides Limitations with abdominal set    Dead Bug 10 reps;3 seconds   Review and  performance   Dead Bug Limitations Pt semi reclined    Other Supine Lumbar Exercises Ab set with SLR x 10 each LE      Lumbar Exercises: Quadruped   Madcat/Old Horse 5 reps    Madcat/Old Horse Limitations VC/TC to engage upper back muscles, TC to get spine articulating    Single Arm Raise Left;Right   3x                      PT Short Term Goals - 06/08/21 1559       PT SHORT TERM GOAL #1   Title independent with initial HEP    Time 4    Period Weeks    Status Achieved    Target Date 06/24/21               PT Long Term Goals - 06/08/21 1559       PT LONG TERM GOAL #4   Title pain decreased </=  2/10 when she is going from sitting to standing and getting out of bed    Time 8    Period Weeks    Status Achieved   2/10 on average                  Plan - 06/17/21 1612     Clinical Impression Statement Patient still having some round ligament pain but overall much improved.  She responded well to the kinesiotape.  She was able to tolerate addition of alternating arm and leg in quadruped.  She continues to demonstrate left gluteal weakness and difficulty maintaining hip symmetry during quadruped hip extension but decreased pain with this activity.    Personal Factors and Comorbidities Comorbidity 2    Comorbidities lumbar disectomy; presently pregnant and due ton 09/25/2021.    Examination-Activity Limitations Bed Mobility;Transfers;Locomotion Level;Sleep;Lift;Stand;Reach Overhead    Examination-Participation Restrictions Meal Prep;Cleaning;Occupation;Driving    Stability/Clinical Decision Making Stable/Uncomplicated    Clinical Decision Making Low    Rehab Potential Poor    PT Frequency Other (comment)    PT Duration 8 weeks    PT Treatment/Interventions ADLs/Self Care Home Management;Aquatic Therapy;Cryotherapy;Moist Heat;Therapeutic activities;Therapeutic exercise;Neuromuscular re-education;Patient/family education;Manual techniques;Taping;Spinal  Manipulations    PT Next Visit Plan Continue to work on gentle hip extension AROM and strength, core as well    PT Home Exercise Plan Access Code: [email or web address]    [id number] and Agree with Plan of Care Patient             Patient will benefit from skilled therapeutic intervention in order to improve the following deficits and impairments:  Difficulty walking, Decreased range of motion, Increased fascial restricitons, Decreased activity tolerance, Decreased strength, Decreased mobility, Pain  Visit Diagnosis: Acute bilateral low back pain without sciatica  Muscle spasm of back     Problem List Patient Active Problem List   Diagnosis Date Noted   Mixed hyperlipidemia 11/15/2018   Essential hypertension 04/21/2018   Idiopathic intracranial hypertension 04/21/2018   BMI 35.0-35.9,adult 04/21/2018   Primary fibromyalgia syndrome 10/20/2016   S/P spinal surgery 07/23/2016   Depression, major, single episode, mild ([place]) 06/07/2016   GERD (gastroesophageal reflux disease) 06/07/2016   Obesity 06/07/2016   HNP (herniated nucleus pulposus), lumbar 12/09/2015   Sleeping difficulty 07/29/2015   Migraine without status migrainosus, not intractable 07/29/2015   Generalized anxiety disorder 01/28/2015   Cardiac murmur, unspecified 03/29/2014    Anderson Malta B. Yeraldine Forney, PT 12/14/224:17 PM   [facility] @ [place], [place], [address] Phone: [phone]   Fax:  [phone]  Name: Heidi Gibbs MRN: [id number] Date of Birth: [birth year]

## 2021-06-22 ENCOUNTER — Ambulatory Visit: Payer: Medicaid Other | Admitting: Physical Therapy

## 2021-06-22 ENCOUNTER — Encounter: Payer: Self-pay | Admitting: Physical Therapy

## 2021-06-22 ENCOUNTER — Other Ambulatory Visit: Payer: Self-pay

## 2021-06-22 DIAGNOSIS — M545 Low back pain, unspecified: Secondary | ICD-10-CM | POA: Diagnosis not present

## 2021-06-22 DIAGNOSIS — M62830 Muscle spasm of back: Secondary | ICD-10-CM

## 2021-06-22 NOTE — Therapy (Signed)
[facility] @ [place], [place], [address] Phone: [phone]   Fax:  [phone]  Physical Therapy Treatment  Patient Details  Name: Heidi Gibbs MRN: [id number] Date of Birth: 1991/06/15 Referring Provider (PT): Dr. Debbora Dus   Encounter Date: 06/22/2021   PT End of Session - 06/22/21 1059     Visit Number 5    Authorization Type [employer]    PT Start Time 0240    PT Stop Time 1137    PT Time Calculation (min) 38 min    Activity Tolerance Patient tolerated treatment well;No increased pain    Behavior During Therapy WFL for tasks assessed/performed             Past Medical History:  Diagnosis Date   Brain tumor (benign) ([place])    DDD (degenerative disc disease), lumbar    Generalized anxiety disorder    Insomnia    Migraines     Past Surgical History:  Procedure Laterality Date   BACK SURGERY     LUMBAR [place] SURGERY  06/21/2016   WISDOM TOOTH EXTRACTION      There were no vitals filed for this visit.   Subjective Assessment - 06/22/21 1103     Subjective I feel great today. No pain    Currently in Pain? No/denies    Multiple Pain Sites No                [facility] PT Assessment - 06/22/21 0001       AROM   Lumbar Extension WNL                           [facility] Adult PT Treatment/Exercise - 06/22/21 0001       Lumbar Exercises: Stretches   Active Hamstring Stretch Left;Right;2 reps;30 seconds    Active Hamstring Stretch Limitations Seated      Lumbar Exercises: Aerobic   Nustep L2 8 min with PTA present to discuss progress/goals.      Lumbar Exercises: Supine   Clam 20 reps   VC for lower TA activation   Heel Slides 10 reps    Heel Slides Limitations with abdominal set    Other Supine Lumbar Exercises Red  band horizontal abd in semi-reclined 2x10:   VC for core activation   Other Supine Lumbar Exercises 25% ball/adductor squeeze with TA  co-contraction 6x: semii isometric hip abd 5 sec hold 6x      Lumbar Exercises: Quadruped   Madcat/Old Horse --   6 reps: VC for technique   Straight Leg Raise --   3x Bil, VC to keep leg low:   Other Quadruped Lumbar Exercises Childs pose with pillows, TC/VC for breathing for labor & dellivery                       PT Short Term Goals - 06/08/21 1559       PT SHORT TERM GOAL #1   Title independent with initial HEP    Time 4    Period Weeks    Status Achieved    Target Date 06/24/21               PT Long Term Goals - 06/22/21 1131       PT LONG TERM GOAL #2   Title lumbar extension improved so she is able to extend her back past nuetral due to increased lumbar  vertebrae mobility    Time 8    Period Weeks    Status Achieved      PT LONG TERM GOAL #3   Title lay down in supine  with hip extension at 0 degrees due to decreased back pain    Period Weeks    Status On-going   pt can do when she is semi-reclined.   Target Date 07/22/21                   Plan - 06/22/21 1105     Clinical Impression Statement Pt reports her back pain is 90% better. Pt was able to clean her house this weekend with no pain.Pt was educated in lumbopelvic stabilization exercises and gentle core focus exercises that will benefit her in her third trimester.    Personal Factors and Comorbidities Comorbidity 2    Comorbidities lumbar disectomy; presently pregnant and due ton 09/25/2021.    Examination-Activity Limitations Bed Mobility;Transfers;Locomotion Level;Sleep;Lift;Stand;Reach Overhead    Examination-Participation Restrictions Meal Prep;Cleaning;Occupation;Driving    Stability/Clinical Decision Making Stable/Uncomplicated    Rehab Potential Poor    PT Duration 8 weeks    PT Treatment/Interventions ADLs/Self Care Home Management;Aquatic Therapy;Cryotherapy;Moist Heat;Therapeutic activities;Therapeutic exercise;Neuromuscular re-education;Patient/family education;Manual  techniques;Taping;Spinal Manipulations    PT Next Visit Plan Pt may be interested in having her next appt be her last, she will discuss with PT on wednesday.    PT Home Exercise Plan Access Code: [email or web address]    Consulted and Agree with Plan of Care Patient             Patient will benefit from skilled therapeutic intervention in order to improve the following deficits and impairments:  Difficulty walking, Decreased range of motion, Increased fascial restricitons, Decreased activity tolerance, Decreased strength, Decreased mobility, Pain  Visit Diagnosis: Acute bilateral low back pain without sciatica  Muscle spasm of back     Problem List Patient Active Problem List   Diagnosis Date Noted   Mixed hyperlipidemia 11/15/2018   Essential hypertension 04/21/2018   Idiopathic intracranial hypertension 04/21/2018   BMI 35.0-35.9,adult 04/21/2018   Primary fibromyalgia syndrome 10/20/2016   S/P spinal surgery 07/23/2016   Depression, major, single episode, mild ([place]) 06/07/2016   GERD (gastroesophageal reflux disease) 06/07/2016   Obesity 06/07/2016   HNP (herniated nucleus pulposus), lumbar 12/09/2015   Sleeping difficulty 07/29/2015   Migraine without status migrainosus, not intractable 07/29/2015   Generalized anxiety disorder 01/28/2015   Cardiac murmur, unspecified 03/29/2014    Donyale Berthold, PTA 06/22/2021, 11:37 AM  [facility] @ [place], [place], [address] Phone: [phone]   Fax:  [phone]  Name: LYA HOLBEN MRN: [id number] Date of Birth: Apr 03, 1991

## 2021-06-24 ENCOUNTER — Ambulatory Visit: Payer: Medicaid Other

## 2021-06-24 ENCOUNTER — Other Ambulatory Visit: Payer: Self-pay

## 2021-06-24 DIAGNOSIS — M545 Low back pain, unspecified: Secondary | ICD-10-CM

## 2021-06-24 DIAGNOSIS — M62830 Muscle spasm of back: Secondary | ICD-10-CM | POA: Diagnosis not present

## 2021-06-24 NOTE — Patient Instructions (Signed)
Reviewed all of HEP and instructed in alternate hamstring stretch in standing

## 2021-06-24 NOTE — Therapy (Signed)
[facility] @ [place], [place], [address] Phone: [phone]   Fax:  [phone]  Physical Therapy Treatment  Patient Details  Name: Heidi Gibbs MRN: [id number] Date of Birth: [birth year] Referring Provider (PT): Dr. Debbora Dus   Encounter Date: 06/24/2021   PT End of Session - 06/24/21 1048     Visit Number 6    Date for PT Re-Evaluation 07/22/21    Authorization Type [employer]    PT Start Time 4132    PT Stop Time 1046    PT Time Calculation (min) 31 min    Activity Tolerance Patient tolerated treatment well;No increased pain    Behavior During Therapy WFL for tasks assessed/performed             Past Medical History:  Diagnosis Date   Brain tumor (benign) ([place])    DDD (degenerative disc disease), lumbar    Generalized anxiety disorder    Insomnia    Migraines     Past Surgical History:  Procedure Laterality Date   BACK SURGERY     LUMBAR [place] SURGERY  06/21/2016   WISDOM TOOTH EXTRACTION      There were no vitals filed for this visit.   Subjective Assessment - 06/24/21 1019     Subjective Patient states she continues to feel good.  She states she did have some Braxton Hicks contractions after going for a long walk with her Mom after last visit.  These have subsided.  She feels she is doing well enough to continue on her own and would like to be discharged today.    Patient Stated Goals reduce pain and move around    Pain Onset More than a month ago                [facility] PT Assessment - 06/24/21 0001       Assessment   Medical Diagnosis O26.899 Other specified pregnancy related conditions, unspecified trimester    Referring Provider (PT) Dr. Debbora Dus    Onset Date/Surgical Date 05/21/21    Prior Therapy in the past had therapy  2017      Observation/Other Assessments   Focus on Therapeutic Outcomes (FOTO)  ODI 0% disability    Other Surveys  Oswestry  Disability Index    Oswestry Disability Index  0%      AROM   Lumbar Extension WNL    Lumbar - Right Side Bend WNL                           [facility] Adult PT Treatment/Exercise - 06/24/21 0001       Lumbar Exercises: Stretches   Active Hamstring Stretch Left;Right;2 reps;30 seconds    Active Hamstring Stretch Limitations standing at steps      Lumbar Exercises: Aerobic   Nustep L3 8 min with PTA present to discuss progress/goals.      Lumbar Exercises: Supine   Clam 20 reps   VC for lower TA activation   Heel Slides 10 reps    Heel Slides Limitations with abdominal set    Dead Bug 10 reps;3 seconds   Review and performance   Dead Bug Limitations Pt semi reclined    Other Supine Lumbar Exercises Red  band horizontal abd in semi-reclined 2x10:   VC for core activation   Other Supine Lumbar Exercises 25% ball/adductor squeeze with TA co-contraction 6x: semii isometric hip abd  5 sec hold 6x                     PT Education - 06/24/21 1046     Education Details Educated patient in possible change in body mechanics post partum.  Resume HEP and feel free to communicate with MD for return to PT if symptoms return.              PT Short Term Goals - 06/08/21 1559       PT SHORT TERM GOAL #1   Title independent with initial HEP    Time 4    Period Weeks    Status Achieved    Target Date 06/24/21               PT Long Term Goals - 06/24/21 1042       PT LONG TERM GOAL #1   Title independent with advanced HEP to improve lumbar mobility and strength    Time 8    Period Weeks    Status Achieved    Target Date 07/22/21      PT LONG TERM GOAL #2   Title lumbar extension improved so she is able to extend her back past nuetral due to increased lumbar vertebrae mobility    Time 8    Period Weeks    Status Achieved    Target Date 07/22/21      PT LONG TERM GOAL #3   Title lay down in supine  with hip extension at 0 degrees due to  decreased back pain    Baseline lay with hips at -10 degrees    Time 8    Period Weeks    Status Achieved      PT LONG TERM GOAL #4   Title pain decreased </= 2/10 when she is going from sitting to standing and getting out of bed    Baseline pain level 4-8/10    Time 8    Period Weeks    Status Achieved    Target Date 07/22/21                   Plan - 06/24/21 1048     Clinical Impression Statement Ralynn is doing extremely well.  She is no longer having pain and is walking daily for exercise.  She is compliant with her HEP.  Oswestry score is 0% disability.  She has good hip control and strength bilaterally when performing quadruped exercises now.  She has met all of her goals.  We will discharge at this time due to goals met and patient independent with HEP.  Patient is in agreement with discharge.    Personal Factors and Comorbidities Comorbidity 2    Comorbidities lumbar disectomy; presently pregnant and due ton 09/25/2021.    Examination-Activity Limitations Bed Mobility;Transfers;Locomotion Level;Sleep;Lift;Stand;Reach Overhead    Examination-Participation Restrictions Meal Prep;Cleaning;Occupation;Driving    Stability/Clinical Decision Making Stable/Uncomplicated    Clinical Decision Making Low    Rehab Potential Poor    PT Frequency Other (comment)    PT Duration 8 weeks    PT Treatment/Interventions ADLs/Self Care Home Management;Aquatic Therapy;Cryotherapy;Moist Heat;Therapeutic activities;Therapeutic exercise;Neuromuscular re-education;Patient/family education;Manual techniques;Taping;Spinal Manipulations    PT Next Visit Plan Patient being discharged at this time.    PT Home Exercise Plan Access Code: [email or web address]    Consulted and Agree with Plan of Care Patient             Patient will benefit from skilled therapeutic intervention in  order to improve the following deficits and impairments:  Difficulty walking, Decreased range of motion, Increased fascial  restricitons, Decreased activity tolerance, Decreased strength, Decreased mobility, Pain  Visit Diagnosis: Acute bilateral low back pain without sciatica  Muscle spasm of back  PHYSICAL THERAPY DISCHARGE SUMMARY  Visits from Start of Care: 6  Current functional level related to goals / functional outcomes: See above   Remaining deficits: none   Education / Equipment: See above   Patient agrees to discharge. Patient goals were met. Patient is being discharged due to meeting the stated rehab goals.    Problem List Patient Active Problem List   Diagnosis Date Noted   Mixed hyperlipidemia 11/15/2018   Essential hypertension 04/21/2018   Idiopathic intracranial hypertension 04/21/2018   BMI 35.0-35.9,adult 04/21/2018   Primary fibromyalgia syndrome 10/20/2016   S/P spinal surgery 07/23/2016   Depression, major, single episode, mild ([place]) 06/07/2016   GERD (gastroesophageal reflux disease) 06/07/2016   Obesity 06/07/2016   HNP (herniated nucleus pulposus), lumbar 12/09/2015   Sleeping difficulty 07/29/2015   Migraine without status migrainosus, not intractable 07/29/2015   Generalized anxiety disorder 01/28/2015   Cardiac murmur, unspecified 03/29/2014    Anderson Malta B. Belton Peplinski, PT 06/24/2209:56 AM   [facility] @ [place], [place], [address] Phone: [phone]   Fax:  [phone]  Name: ZELLIE JENNING MRN: [id number] Date of Birth: 1990/08/24

## 2021-06-30 ENCOUNTER — Ambulatory Visit: Payer: Medicaid Other

## 2021-07-02 DIAGNOSIS — Z3480 Encounter for supervision of other normal pregnancy, unspecified trimester: Secondary | ICD-10-CM | POA: Diagnosis not present

## 2021-07-05 DIAGNOSIS — Z419 Encounter for procedure for purposes other than remedying health state, unspecified: Secondary | ICD-10-CM | POA: Diagnosis not present

## 2021-07-05 NOTE — L&D Delivery Note (Signed)
Delivery Note ?Heidi Gibbs is a G1P0000 at 56w0dwho had a spontaneous delivery at 2010 a viable female was delivered via LOA.  APGAR: 6, 7 ; weight 7lb5.5oz (3330g) .    ? ?Admitted for induction of labor for chronic hypertension.  Induced with cytotec, foley balloon, pitocin, AROM. Progressed normally. Received epidural for pain management. Pushed for 40 minutes. Baby was delivered without difficulty. Loose nuchal cord reduced after delivery of fetal shoulders.  Delayed cord clamping for 60 seconds. Delivery of placenta was spontaneous. Placenta was found to be intact, 3 -vessel cord was noted. Perineum intact.   ? ?NICU team called by RN due to poor infant tone and respiratory effort. Resuscitation with oxygen and PPV with good response. See NICU note.  ? ?Uterine atony noted and treated with misoprostol 10059m. TXA1g administered. Subsequent fundal rub demonstrated good uterine tone.  ?Instrument and gauze counts were correct at the end of the procedure. ? ?Placenta status:  to pathology due to history of chronic abruption ? ?Anesthesia:  epidural ?Episiotomy:  none ?Lacerations:  none ?Suture Repair: n/a ?Est. Blood Loss (mL): 300 ? ?Mom to postpartum.  Baby to Couplet care / Skin to Skin. ? ?[name]3/17/2023, 10:26 PM ? ?

## 2021-07-10 DIAGNOSIS — O99810 Abnormal glucose complicating pregnancy: Secondary | ICD-10-CM | POA: Diagnosis not present

## 2021-07-31 ENCOUNTER — Observation Stay (HOSPITAL_COMMUNITY)
Admission: AD | Admit: 2021-07-31 | Discharge: 2021-08-01 | Disposition: A | Discharge status: Home / Self Care | Payer: Medicaid Other | Attending: Obstetrics and Gynecology | Admitting: Obstetrics and Gynecology

## 2021-07-31 ENCOUNTER — Inpatient Hospital Stay (HOSPITAL_BASED_OUTPATIENT_CLINIC_OR_DEPARTMENT_OTHER): Payer: Medicaid Other

## 2021-07-31 ENCOUNTER — Encounter (HOSPITAL_COMMUNITY): Payer: Self-pay

## 2021-07-31 ENCOUNTER — Other Ambulatory Visit: Payer: Self-pay

## 2021-07-31 DIAGNOSIS — R109 Unspecified abdominal pain: Secondary | ICD-10-CM | POA: Diagnosis not present

## 2021-07-31 DIAGNOSIS — Z3689 Encounter for other specified antenatal screening: Secondary | ICD-10-CM | POA: Diagnosis not present

## 2021-07-31 DIAGNOSIS — Z3A32 32 weeks gestation of pregnancy: Secondary | ICD-10-CM | POA: Insufficient documentation

## 2021-07-31 DIAGNOSIS — O4590 Premature separation of placenta, unspecified, unspecified trimester: Principal | ICD-10-CM | POA: Insufficient documentation

## 2021-07-31 DIAGNOSIS — O26893 Other specified pregnancy related conditions, third trimester: Secondary | ICD-10-CM | POA: Diagnosis not present

## 2021-07-31 DIAGNOSIS — O133 Gestational [pregnancy-induced] hypertension without significant proteinuria, third trimester: Secondary | ICD-10-CM | POA: Diagnosis not present

## 2021-07-31 DIAGNOSIS — O4593 Premature separation of placenta, unspecified, third trimester: Secondary | ICD-10-CM

## 2021-07-31 DIAGNOSIS — O99213 Obesity complicating pregnancy, third trimester: Secondary | ICD-10-CM | POA: Diagnosis not present

## 2021-07-31 DIAGNOSIS — O99891 Other specified diseases and conditions complicating pregnancy: Secondary | ICD-10-CM | POA: Diagnosis not present

## 2021-07-31 DIAGNOSIS — Z20822 Contact with and (suspected) exposure to covid-19: Secondary | ICD-10-CM | POA: Diagnosis not present

## 2021-07-31 LAB — CBC
HCT: 31.3 % — ABNORMAL LOW (ref 36.0–46.0)
Hemoglobin: 10 g/dL — ABNORMAL LOW (ref 12.0–15.0)
MCH: 27.3 pg (ref 26.0–34.0)
MCHC: 31.9 g/dL (ref 30.0–36.0)
MCV: 85.5 fL (ref 80.0–100.0)
Platelets: 258 10*3/uL (ref 150–400)
RBC: 3.66 MIL/uL — ABNORMAL LOW (ref 3.87–5.11)
RDW: 13.8 % (ref 11.5–15.5)
WBC: 13.6 10*3/uL — ABNORMAL HIGH (ref 4.0–10.5)
nRBC: 0 % (ref 0.0–0.2)

## 2021-07-31 LAB — CBC WITH DIFFERENTIAL/PLATELET
Abs Immature Granulocytes: 0.22 10*3/uL — ABNORMAL HIGH (ref 0.00–0.07)
Basophils Absolute: 0.1 10*3/uL (ref 0.0–0.1)
Basophils Relative: 0 %
Eosinophils Absolute: 0.2 10*3/uL (ref 0.0–0.5)
Eosinophils Relative: 1 %
HCT: 35.5 % — ABNORMAL LOW (ref 36.0–46.0)
Hemoglobin: 11.8 g/dL — ABNORMAL LOW (ref 12.0–15.0)
Immature Granulocytes: 2 %
Lymphocytes Relative: 17 %
Lymphs Abs: 2.5 10*3/uL (ref 0.7–4.0)
MCH: 28.1 pg (ref 26.0–34.0)
MCHC: 33.2 g/dL (ref 30.0–36.0)
MCV: 84.5 fL (ref 80.0–100.0)
Monocytes Absolute: 0.9 10*3/uL (ref 0.1–1.0)
Monocytes Relative: 6 %
Neutro Abs: 10.6 10*3/uL — ABNORMAL HIGH (ref 1.7–7.7)
Neutrophils Relative %: 74 %
Platelets: 281 10*3/uL (ref 150–400)
RBC: 4.2 MIL/uL (ref 3.87–5.11)
RDW: 13.6 % (ref 11.5–15.5)
WBC: 14.4 10*3/uL — ABNORMAL HIGH (ref 4.0–10.5)
nRBC: 0 % (ref 0.0–0.2)

## 2021-07-31 LAB — TYPE AND SCREEN
ABO/RH(D): O POS
Antibody Screen: NEGATIVE

## 2021-07-31 LAB — COMPREHENSIVE METABOLIC PANEL
ALT: 11 U/L (ref 0–44)
AST: 14 U/L — ABNORMAL LOW (ref 15–41)
Albumin: 2.7 g/dL — ABNORMAL LOW (ref 3.5–5.0)
Alkaline Phosphatase: 92 U/L (ref 38–126)
Anion gap: 7 (ref 5–15)
BUN: <5 mg/dL — ABNORMAL LOW (ref 6–20)
CO2: 22 mmol/L (ref 22–32)
Calcium: 8.8 mg/dL — ABNORMAL LOW (ref 8.9–10.3)
Chloride: 106 mmol/L (ref 98–111)
Creatinine, Ser: 0.52 mg/dL (ref 0.44–1.00)
GFR, Estimated: >60 mL/min (ref >60)
Glucose, Bld: 80 mg/dL (ref 70–99)
Potassium: 4.1 mmol/L (ref 3.5–5.1)
Sodium: 135 mmol/L (ref 135–145)
Total Bilirubin: 0.6 mg/dL (ref 0.3–1.2)
Total Protein: 6.5 g/dL (ref 6.5–8.1)

## 2021-07-31 LAB — URINALYSIS, ROUTINE W REFLEX MICROSCOPIC
Bilirubin Urine: NEGATIVE
Glucose, UA: NEGATIVE mg/dL
Hgb urine dipstick: NEGATIVE
Ketones, ur: 80 mg/dL — AB
Leukocytes,Ua: NEGATIVE
Nitrite: NEGATIVE
Protein, ur: NEGATIVE mg/dL
Specific Gravity, Urine: 1.018 (ref 1.005–1.030)
pH: 6 (ref 5.0–8.0)

## 2021-07-31 LAB — RESP PANEL BY RT-PCR (FLU A&B, COVID) ARPGX2
Influenza A by PCR: NEGATIVE
Influenza B by PCR: NEGATIVE
SARS Coronavirus 2 by RT PCR: NEGATIVE

## 2021-07-31 LAB — PROTEIN / CREATININE RATIO, URINE
Creatinine, Urine: 151.41 mg/dL
Protein Creatinine Ratio: 0.08 mg/mg{Cre} (ref 0.00–0.15)
Total Protein, Urine: 12 mg/dL

## 2021-07-31 MED ORDER — LABETALOL HCL 100 MG PO TABS
100.0000 mg | ORAL_TABLET | Freq: Two times a day (BID) | ORAL | Status: DC
  Administered 2021-07-31 – 2021-08-01 (×3): 100 mg via ORAL
  Filled 2021-07-31 (×3): qty 1

## 2021-07-31 MED ORDER — ACETAMINOPHEN 325 MG PO TABS: 650.0000 mg | ORAL_TABLET | ORAL | Status: DC | PRN

## 2021-07-31 MED ORDER — ZOLPIDEM TARTRATE 5 MG PO TABS: 5.0000 mg | ORAL_TABLET | Freq: Every evening | ORAL | Status: DC | PRN

## 2021-07-31 MED ORDER — PRENATAL MULTIVITAMIN CH
1.0000 | ORAL_TABLET | Freq: Every day | ORAL | Status: DC
  Administered 2021-08-01: 1 via ORAL
  Filled 2021-07-31: qty 1

## 2021-07-31 MED ORDER — DOCUSATE SODIUM 100 MG PO CAPS
100.0000 mg | ORAL_CAPSULE | Freq: Every day | ORAL | Status: DC
  Administered 2021-08-01: 100 mg via ORAL
  Filled 2021-07-31: qty 1

## 2021-07-31 MED ORDER — CALCIUM CARBONATE ANTACID 500 MG PO CHEW: 2.0000 | CHEWABLE_TABLET | ORAL | Status: DC | PRN

## 2021-07-31 NOTE — MAU Note (Signed)
Heidi Gibbs is a 31 y.o. at [redacted]w[redacted]d here in MAU reporting: was in the office today for growth scan today. States she was told it looked like she had an abruption. No bleeding. Having some lower abdominal pain. No LOF. +FM. No recent abdominal trauma. States BP was 160/90 in the office. States within the past week has had increased bruising on her legs.  Onset of complaint: today  Pain score: 3/10  Vitals:   07/31/21 1036  BP: (!) 146/96  Pulse: (!) 122  Resp: 18  Temp: 98.4 F (36.9 C)  SpO2: 97%     FHT:132  Lab orders placed from triage: UA

## 2021-07-31 NOTE — MAU Provider Note (Signed)
History     CSN: [id number]  Arrival date and time: 07/31/21 1024   None     Chief Complaint  Patient presents with   Abdominal Pain   Heidi Gibbs is a 31 yo female G1P0 at [redacted] weeks gestation, with a history of chronic hypertension on no medication, pseudotumor cerebri, fibromyalgia, and elevated BMI, presenting from her OB office by Dr. Rogue Bussing for evaluation of possible placental abruption. She was having a growth [place] today when it appeared there was a large collected of fluid behind the placenta concerning for abruption. Sent here for MFM repeat imaging/evaluation, labs, and prolonged monitoring.   She has been having some lower abdominal cramping but no vaginal bleeding. Also reports easy bruising and a new onset pruritic rash on her bilateral arms/legs since Sunday. States she has several bruises on her lower legs that she doesn't remember hitting her legs into anything. Denies any abdominal trauma or falls. Denies illicit drug use. 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   She has known chronic hypertension without medications. It is been relatively normal until the last several weeks it has been increasing per her report. Denies any HA, blurred vision, RUQ abdominal pain, or significant edema.   Past Medical History:  Diagnosis Date   Brain tumor (benign) ([place])    DDD (degenerative disc disease), lumbar    Generalized anxiety disorder    Insomnia    Migraines     Past Surgical History:  Procedure Laterality Date   BACK SURGERY     LUMBAR DISC SURGERY  06/21/2016   WISDOM TOOTH EXTRACTION      Family History  Problem Relation Age of Onset   Diabetes Mother    Hypertension Mother    Diabetes Father     Social History   Tobacco Use   Smoking status: Never   Smokeless tobacco: Never  Vaping Use   Vaping Use: Never used  Substance Use Topics   Alcohol use: No   Drug use: No    Allergies:  Allergies  Allergen Reactions   Erythromycin Hives    Penicillins Nausea And Vomiting    Has patient had a PCN reaction causing immediate rash, facial/tongue/throat swelling, SOB or lightheadedness with hypotension: No Has patient had a PCN reaction causing severe rash involving mucus membranes or skin necrosis: No Has patient had a PCN reaction that required hospitalization No Has patient had a PCN reaction occurring within the last 10 years: Yes If all of the above answers are "NO", then may proceed with Cephalosporin use.    Tamiflu [Oseltamivir Phosphate] Nausea And Vomiting   Clindamycin Hives   Latex    Oseltamivir Nausea And Vomiting    Medications Prior to Admission  Medication Sig Dispense Refill Last Dose   aspirin EC 81 MG tablet Take 81 mg by mouth daily. Swallow whole.   07/30/2021 at 2100   Prenatal Vit-Fe Fumarate-FA (MULTIVITAMIN-PRENATAL) 27-0.8 MG TABS tablet Take 1 tablet by mouth daily at 12 noon.   07/30/2021 at 2100   sertraline (ZOLOFT) 25 MG tablet Take 150 mg by mouth daily.   07/30/2021 at 2100   acetaZOLAMIDE (DIAMOX) 500 MG capsule Take 500 mg by mouth 2 (two) times daily.      Ascorbic Acid (VITAMIN C CR) 500 MG CPCR Vitamin C 500 mg capsule,extended release  Take by oral route.      HYDROcodone-homatropine (HYCODAN) 5-1.5 MG/5ML syrup Take 5 mLs by mouth every 6 (six) hours as needed for  cough. 120 mL 0    JOLESSA 0.15-0.03 MG tablet Take 1 tablet by mouth daily.      metoprolol succinate (TOPROL-XL) 25 MG 24 hr tablet TAKE 1 TABLET DAILY 90 tablet 3    predniSONE (STERAPRED UNI-PAK 21 TAB) 10 MG (21) TBPK tablet Use as directed on back of pill pack 21 tablet 0    traZODone (DESYREL) 100 MG tablet TAKE ONE-HALF (1/2) TABLET AT BEDTIME AS NEEDED FOR SLEEP 45 tablet 3     Review of Systems  Constitutional:  Negative for activity change, appetite change, fatigue and fever.  Respiratory:  Negative for chest tightness and shortness of breath.   Cardiovascular:  Negative for leg swelling.  Gastrointestinal:  Negative  for constipation, diarrhea, nausea and vomiting.  Genitourinary:  Positive for pelvic pain. Negative for dysuria, vaginal bleeding and vaginal discharge.  Skin:  Positive for rash.  Neurological:  Negative for dizziness, weakness, light-headedness and headaches.  Hematological:  Bruises/bleeds easily.  Physical Exam   Blood pressure 132/80, pulse (!) 108, temperature 98.4 F (36.9 C), temperature source Oral, resp. rate 18, height 5\' 10"  (1.778 m), weight 117.1 kg, SpO2 98 %.  Physical Exam Constitutional:      General: She is not in acute distress.    Appearance: She is well-developed. She is not ill-appearing.  HENT:     Head: Normocephalic and atraumatic.     Mouth/Throat:     Mouth: Mucous membranes are moist.  Eyes:     Extraocular Movements: Extraocular movements intact.  Cardiovascular:     Rate and Rhythm: Tachycardia present.  Pulmonary:     Effort: Pulmonary effort is normal.  Abdominal:     Comments: Soft. Gravid uterus appropriate for gestational age.   Genitourinary:    Comments: No vaginal bleeding present with cervical exam  Skin:    General: Skin is warm and dry.     Findings: Rash present.     Comments: Erythematous scatter papular rash present on bilateral forearms and lower legs. Excoriations present with scabbing on top of few papules.   Neurological:     Mental Status: She is alert.   NST:  Baseline 135 bpm Moderate Variability  + 15x15 accelerations  No decelerations  Difficult to trace on toco> does appear to have a few ctx   Cervical exam: Internal os closed (external os 1cm), very thick, and posterior   MAU Course   MDM MFM OB limited to evaluate placenta  Admit labs: CBC, RPR, Type and screen, CMP + P/Cr ratio (with worsening cHTN)  NST: Reactive, Cat I   Ultrasound report showing a hypoechoic area suggestive of a retroplacental bleed around the posterior fundal portion of the placenta. Internal os closed. Minimal to no contractions on  toco. Discussed with Dr. Rip Harbour and then with Dr. Wilhelmenia Blase who came to evaluate the patient. Recommends observation to [facility].   Assessment and Plan   Abdominal Cramping  2.   Retroplacental bleed affecting delivery  3.   Pruritic non-specific extremity rash  4.   [redacted] weeks gestation of pregnancy 5.   Chronic hypertension, not on medication but with elevated blood pressures, asymptomatic   Admit to [facility] care for observation with CEFM.     Patriciaann Clan 07/31/2021, 1:36 PM

## 2021-07-31 NOTE — Progress Notes (Signed)
Patient comfortable in AP room. Endorses +FM, denies VB, LOF, denies cramping from admission. Denies PreE symptoms.   BP 137/77    Pulse (!) 116    Temp 98.4 F (36.9 C) (Oral)    Resp 18    Ht 5\' 10"  (1.778 m)    Wt 117.1 kg    SpO2 98%    BMI 37.05 kg/m  Benign abdominal exam Category 1 tracing, no activity on TOCO  Repeat labs to be drawn at 2000 then 0500. Given benign exam, can change monitoring to q-shift. CHTN - Lab 100mg  BID - since initiation at 1530, normotensive. Continue to monitor. Please notify MD if persistently >150/100

## 2021-07-31 NOTE — H&P (Signed)
ANTEPARTUM H&P S: Patient presented to MAU from office after growth scan done for BMI 35 and CHTN no Rx showed retroplacental fluid concerning for abruption. +FM, denies VB or LOF, notes some irregular lower abdominal cramping. Denies fevers, chills, changes in bowel/bladder habits.  PNC c/b BMI 37 (failed 1hr, 3hr WNL), benign intracranial HTN w/ pseudotumor cerebri (formerly on metoprolol but no Rx thus far), fibromyalgia. Blood pressure in MAU thus far 120s-140s/80s-90s, however patient denies PreE symptoms including N/V, RUQ pain, vision changes, LE edema.  O: BP (!) 143/79 (BP Location: Right Arm)    Pulse (!) 112    Temp 98.4 F (36.9 C) (Oral)    Resp 18    Ht 5\' 10"  (1.778 m)    Wt 117.1 kg    SpO2 98%    BMI 37.05 kg/m  Gen: NAD CV: tachycardic with regular rhythm, no M/R/G Abd: Gravid, NTTP, S=D GU: closed, exam neg for VB, thick and posterior MSK/skin: urticarial rash, NTTP, over dorsal UE Psych/Neuro: WNL  MFM [place] today: vtx SIUP, FHR 132bpm, small retro-placental fluid located in upper right placenta measuring 3.9x1.5x1.4cm) - possible retroplacental blled. AFI 16. Impression: Limited exam to assess abdominal pain  Good fetal movement and amniotic fluid  Posterior fundal portion of the placenta appears that there is a hypoechoic area suggestive of a retroplacental bleed. Placental abruption is including in the differential, however, placental abruption is a clinical diagnosis.  Labs: 11.8/35.5/281, Opos, Cr 0.52, 14/11, uPC 0.08  A/P: This is a 31yo G1P0 @ 15 0/7 by LMP c/w 10wk TVUS presenting with retroplacental fluid collection in setting of known CHTN; possible abruption is in DDX. However, patient labs are WNL; benign abdominal exam with no VB on GU exam. Currently, category 1 tracing with no activity on TOCO. However, given mildly elevated pressures with fluid noted on scan and, though low, abruption a possibility, will admit for 24hr observation with serial labs.   1) CHTN -  known intracranial HTN. Begin Labetalol 100mg  BID (avoid CCB)    *PreE labs WNL on admission, pt asymptomatic  2) Possible placental abruption: Labs q8hrs, CEFM for next 4hrs    *If Category 1 without cramping per pt, may shift to q-fhit tracing  3) IUP @ 32 0/7: low indication for delivery at this time, vtx SIUP. Will require antenatal monitoring based on ultrasound findings plus CHTN on Rx  Regular diet, up ad lib. Anticipate DC home tomorrow if exam unchanged and labs stable.

## 2021-08-01 LAB — CBC
HCT: 32.4 % — ABNORMAL LOW (ref 36.0–46.0)
Hemoglobin: 10.6 g/dL — ABNORMAL LOW (ref 12.0–15.0)
MCH: 27.7 pg (ref 26.0–34.0)
MCHC: 32.7 g/dL (ref 30.0–36.0)
MCV: 84.8 fL (ref 80.0–100.0)
Platelets: 255 10*3/uL (ref 150–400)
RBC: 3.82 MIL/uL — ABNORMAL LOW (ref 3.87–5.11)
RDW: 13.9 % (ref 11.5–15.5)
WBC: 13.8 10*3/uL — ABNORMAL HIGH (ref 4.0–10.5)
nRBC: 0 % (ref 0.0–0.2)

## 2021-08-01 LAB — RPR: RPR Ser Ql: NONREACTIVE

## 2021-08-01 MED ORDER — LABETALOL HCL 100 MG PO TABS: 100.0000 mg | ORAL_TABLET | Freq: Two times a day (BID) | ORAL | 4 refills | Status: DC

## 2021-08-01 NOTE — Discharge Summary (Signed)
Physician Discharge Summary  Patient ID: Heidi Gibbs MRN: [id number] DOB/AGE: Jul 13, 1990 31 y.o.  Admit date: 07/31/2021 Discharge date: 08/01/2021  Admission Diagnoses:possible abruption  Discharge Diagnoses:  Principal Problem:   Ultrasound scan to evaluate placenta for abruption   Discharged Condition: good  Hospital Course: Pt observed for 24 hours after noting a possible fundal abruption seen on routine f/u US. Also having some CHTN increases and started on labetalol.    Consults: None  MFM read US>   Significant Diagnostic Studies: [place]  Treatments: none  Discharge Exam: Blood pressure 111/60, pulse 100, temperature 98.1 F (36.7 C), temperature source Oral, resp. rate 18, height 5\' 10"  (1.778 m), weight 117.1 kg, SpO2 99 %.   Disposition: Discharge disposition: 01-Home or Self Care       Discharge Instructions     Discharge activity:  Up to eat   Complete by: As directed    Discharge diet:  No restrictions   Complete by: As directed    Discharge instructions   Complete by: As directed    Modified bedrest.  Refrain from intercourse.  Count baby's movements in 1 hour per day- if you don't get 6 in that hour, call.   Do not have sex or do anything that might make you have an orgasm   Complete by: As directed    Fetal Kick Count:  Lie on our left side for one hour after a meal, and count the number of times your baby kicks.  If it is less than 5 times, get up, move around and drink some juice.  Repeat the test 30 minutes later.  If it is still less than 5 kicks in an hour, notify your doctor.   Complete by: As directed    LABOR:  When conractions begin, you should start to time them from the beginning of one contraction to the beginning  of the next.  When contractions are 5 - 10 minutes apart or less and have been regular for at least an hour, you should call your health care provider.   Complete by: As directed    Notify physician for bleeding from the  vagina   Complete by: As directed    Notify physician for blurring of vision or spots before the eyes   Complete by: As directed    Notify physician for chills or fever   Complete by: As directed    Notify physician for fainting spells, "black outs" or loss of consciousness   Complete by: As directed    Notify physician for increase in vaginal discharge   Complete by: As directed    Notify physician for leaking of fluid   Complete by: As directed    Notify physician for pain or burning when urinating   Complete by: As directed    Notify physician for pelvic pressure (sudden increase)   Complete by: As directed    Notify physician for severe or continued nausea or vomiting   Complete by: As directed    Notify physician for sudden gushing of fluid from the vagina (with or without continued leaking)   Complete by: As directed    Notify physician for sudden, constant, or occasional abdominal pain   Complete by: As directed    Notify physician if baby moving less than usual   Complete by: As directed       Allergies as of 08/01/2021       Reactions   Erythromycin Hives   Penicillins Nausea And Vomiting  Has patient had a PCN reaction causing immediate rash, facial/tongue/throat swelling, SOB or lightheadedness with hypotension: No Has patient had a PCN reaction causing severe rash involving mucus membranes or skin necrosis: No Has patient had a PCN reaction that required hospitalization No Has patient had a PCN reaction occurring within the last 10 years: Yes If all of the above answers are "NO", then may proceed with Cephalosporin use.   Tamiflu [oseltamivir Phosphate] Nausea And Vomiting   Clindamycin Hives   Latex    Oseltamivir Nausea And Vomiting        Medication List     STOP taking these medications    acetaZOLAMIDE ER 500 MG capsule Commonly known as: DIAMOX   HYDROcodone-homatropine 5-1.5 MG/5ML syrup Commonly known as: HYCODAN   Jolessa 0.15-0.03 MG  tablet Generic drug: levonorgestrel-ethinyl estradiol   metoprolol succinate 25 MG 24 hr tablet Commonly known as: TOPROL-XL   predniSONE 10 MG (21) Tbpk tablet Commonly known as: STERAPRED UNI-PAK 21 TAB   traZODone 100 MG tablet Commonly known as: DESYREL   Vitamin C CR 500 MG Cpcr       TAKE these medications    aspirin EC 81 MG tablet Take 81 mg by mouth daily. Swallow whole.   labetalol 100 MG tablet Commonly known as: NORMODYNE Take 1 tablet (100 mg total) by mouth 2 (two) times daily.   multivitamin-prenatal 27-0.8 MG Tabs tablet Take 1 tablet by mouth daily at 12 noon.   sertraline 25 MG tablet Commonly known as: ZOLOFT Take 150 mg by mouth daily.        Follow-up Information     Ob/Gyn, [facility] Follow up in 1 week(s).   Why: see next week Contact information: [place] [address] [phone]                 Signed: Daria Pastures 08/01/2021, 2:23 PM

## 2021-08-01 NOTE — Progress Notes (Signed)
FHTS this am 120s, gSTV, NST R Toco rare  D/c to home with precautions discussed.  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    Heidi Gibbs

## 2021-08-01 NOTE — Progress Notes (Addendum)
31 y.o. G1P0000 [redacted]w[redacted]d HD#0 admitted for Ultrasound scan to evaluate placenta for abruption [Z36.89].  Pt currently stable with no c/o today.  Good FM.  Vitals:   07/31/21 1519 07/31/21 2004 07/31/21 2359 08/01/21 0451  BP: 137/77 122/84 117/65 (!) 104/51  Pulse: (!) 116 97 97 (!) 106  Resp:  18 16 16   Temp:  98.6 F (37 C) 98.2 F (36.8 C) 98.4 F (36.9 C)  TempSrc:  Oral Oral Oral  SpO2:  98% 99% 99%  Weight:      Height:        Lungs CTA Cor RRR Abd  Soft, gravid, nontender Ex SCDs FHTs  Last night 110s, good short term variability, NST R Toco  rare  Results for orders placed or performed during the hospital encounter of 07/31/21 (from the past 24 hour(s))  CBC with Differential/Platelet     Status: Abnormal   Collection Time: 07/31/21 11:56 AM  Result Value Ref Range   WBC 14.4 (H) 4.0 - 10.5 K/uL   RBC 4.20 3.87 - 5.11 MIL/uL   Hemoglobin 11.8 (L) 12.0 - 15.0 g/dL   HCT 35.5 (L) 36.0 - 46.0 %   MCV 84.5 80.0 - 100.0 fL   MCH 28.1 26.0 - 34.0 pg   MCHC 33.2 30.0 - 36.0 g/dL   RDW 13.6 11.5 - 15.5 %   Platelets 281 150 - 400 K/uL   nRBC 0.0 0.0 - 0.2 %   Neutrophils Relative % 74 %   Neutro Abs 10.6 (H) 1.7 - 7.7 K/uL   Lymphocytes Relative 17 %   Lymphs Abs 2.5 0.7 - 4.0 K/uL   Monocytes Relative 6 %   Monocytes Absolute 0.9 0.1 - 1.0 K/uL   Eosinophils Relative 1 %   Eosinophils Absolute 0.2 0.0 - 0.5 K/uL   Basophils Relative 0 %   Basophils Absolute 0.1 0.0 - 0.1 K/uL   Immature Granulocytes 2 %   Abs Immature Granulocytes 0.22 (H) 0.00 - 0.07 K/uL  Type and screen [place]     Status: None   Collection Time: 07/31/21 11:56 AM  Result Value Ref Range   ABO/RH(D) O POS    Antibody Screen NEG    Sample Expiration      08/03/2021,2359 Performed at [facility], [address], [place], [place] [address]   Comprehensive metabolic panel     Status: Abnormal   Collection Time: 07/31/21 11:56 AM  Result Value Ref Range   Sodium  135 135 - 145 mmol/L   Potassium 4.1 3.5 - 5.1 mmol/L   Chloride 106 98 - 111 mmol/L   CO2 22 22 - 32 mmol/L   Glucose, Bld 80 70 - 99 mg/dL   BUN <5 (L) 6 - 20 mg/dL   Creatinine, Ser 0.52 0.44 - 1.00 mg/dL   Calcium 8.8 (L) 8.9 - 10.3 mg/dL   Total Protein 6.5 6.5 - 8.1 g/dL   Albumin 2.7 (L) 3.5 - 5.0 g/dL   AST 14 (L) 15 - 41 U/L   ALT 11 0 - 44 U/L   Alkaline Phosphatase 92 38 - 126 U/L   Total Bilirubin 0.6 0.3 - 1.2 mg/dL   GFR, Estimated >60 >60 mL/min   Anion gap 7 5 - 15  Urinalysis, Routine w reflex microscopic Urine, Clean Catch     Status: Abnormal   Collection Time: 07/31/21  1:07 PM  Result Value Ref Range   Color, Urine YELLOW YELLOW  APPearance HAZY (A) CLEAR   Specific Gravity, Urine 1.018 1.005 - 1.030   pH 6.0 5.0 - 8.0   Glucose, UA NEGATIVE NEGATIVE mg/dL   Hgb urine dipstick NEGATIVE NEGATIVE   Bilirubin Urine NEGATIVE NEGATIVE   Ketones, ur 80 (A) NEGATIVE mg/dL   Protein, ur NEGATIVE NEGATIVE mg/dL   Nitrite NEGATIVE NEGATIVE   Leukocytes,Ua NEGATIVE NEGATIVE  Protein / creatinine ratio, urine     Status: None   Collection Time: 07/31/21  1:07 PM  Result Value Ref Range   Creatinine, Urine 151.41 mg/dL   Total Protein, Urine 12 mg/dL   Protein Creatinine Ratio 0.08 0.00 - 0.15 mg/mg[Cre]  Resp Panel by RT-PCR (Flu A&B, Covid) Nasopharyngeal Swab     Status: None   Collection Time: 07/31/21  2:53 PM   Specimen: Nasopharyngeal Swab; Nasopharyngeal(NP) swabs in vial transport medium  Result Value Ref Range   SARS Coronavirus 2 by RT PCR NEGATIVE NEGATIVE   Influenza A by PCR NEGATIVE NEGATIVE   Influenza B by PCR NEGATIVE NEGATIVE  CBC     Status: Abnormal   Collection Time: 07/31/21  8:49 PM  Result Value Ref Range   WBC 13.6 (H) 4.0 - 10.5 K/uL   RBC 3.66 (L) 3.87 - 5.11 MIL/uL   Hemoglobin 10.0 (L) 12.0 - 15.0 g/dL   HCT 31.3 (L) 36.0 - 46.0 %   MCV 85.5 80.0 - 100.0 fL   MCH 27.3 26.0 - 34.0 pg   MCHC 31.9 30.0 - 36.0 g/dL   RDW 13.8  11.5 - 15.5 %   Platelets 258 150 - 400 K/uL   nRBC 0.0 0.0 - 0.2 %  CBC     Status: Abnormal   Collection Time: 08/01/21  4:24 AM  Result Value Ref Range   WBC 13.8 (H) 4.0 - 10.5 K/uL   RBC 3.82 (L) 3.87 - 5.11 MIL/uL   Hemoglobin 10.6 (L) 12.0 - 15.0 g/dL   HCT 32.4 (L) 36.0 - 46.0 %   MCV 84.8 80.0 - 100.0 fL   MCH 27.7 26.0 - 34.0 pg   MCHC 32.7 30.0 - 36.0 g/dL   RDW 13.9 11.5 - 15.5 %   Platelets 255 150 - 400 K/uL   nRBC 0.0 0.0 - 0.2 %    AP:  HD#0  [redacted]w[redacted]d with possible placental abruption site with no bleeding and no other sx.  Pt's H/H has stayed stable. US showed normal fluid and MFM did not recommend any further.  Will get NST today and consider d/c if continues stable.   Pt with CHTN, now started on labetalol (not procardia ssecondary intracranial hypertension) and BPS normal and occ low.  Will need BP check next week.  Daria Pastures

## 2021-08-04 DIAGNOSIS — Z3A32 32 weeks gestation of pregnancy: Secondary | ICD-10-CM | POA: Diagnosis not present

## 2021-08-04 DIAGNOSIS — O99213 Obesity complicating pregnancy, third trimester: Secondary | ICD-10-CM | POA: Diagnosis not present

## 2021-08-05 DIAGNOSIS — Z419 Encounter for procedure for purposes other than remedying health state, unspecified: Secondary | ICD-10-CM | POA: Diagnosis not present

## 2021-08-07 DIAGNOSIS — O10013 Pre-existing essential hypertension complicating pregnancy, third trimester: Secondary | ICD-10-CM | POA: Diagnosis not present

## 2021-08-11 DIAGNOSIS — Z3A33 33 weeks gestation of pregnancy: Secondary | ICD-10-CM | POA: Diagnosis not present

## 2021-08-11 DIAGNOSIS — O10013 Pre-existing essential hypertension complicating pregnancy, third trimester: Secondary | ICD-10-CM | POA: Diagnosis not present

## 2021-08-14 DIAGNOSIS — Z3A34 34 weeks gestation of pregnancy: Secondary | ICD-10-CM | POA: Diagnosis not present

## 2021-08-14 DIAGNOSIS — O10013 Pre-existing essential hypertension complicating pregnancy, third trimester: Secondary | ICD-10-CM | POA: Diagnosis not present

## 2021-08-18 DIAGNOSIS — O10013 Pre-existing essential hypertension complicating pregnancy, third trimester: Secondary | ICD-10-CM | POA: Diagnosis not present

## 2021-08-18 DIAGNOSIS — Z3A34 34 weeks gestation of pregnancy: Secondary | ICD-10-CM | POA: Diagnosis not present

## 2021-08-21 DIAGNOSIS — O169 Unspecified maternal hypertension, unspecified trimester: Secondary | ICD-10-CM | POA: Diagnosis not present

## 2021-08-21 DIAGNOSIS — Z3A35 35 weeks gestation of pregnancy: Secondary | ICD-10-CM | POA: Diagnosis not present

## 2021-08-25 DIAGNOSIS — O163 Unspecified maternal hypertension, third trimester: Secondary | ICD-10-CM | POA: Diagnosis not present

## 2021-08-28 DIAGNOSIS — Z3A36 36 weeks gestation of pregnancy: Secondary | ICD-10-CM | POA: Diagnosis not present

## 2021-08-28 DIAGNOSIS — O163 Unspecified maternal hypertension, third trimester: Secondary | ICD-10-CM | POA: Diagnosis not present

## 2021-08-28 DIAGNOSIS — Z3480 Encounter for supervision of other normal pregnancy, unspecified trimester: Secondary | ICD-10-CM | POA: Diagnosis not present

## 2021-08-28 LAB — OB RESULTS CONSOLE GBS: GBS: NEGATIVE

## 2021-08-30 DIAGNOSIS — J029 Acute pharyngitis, unspecified: Secondary | ICD-10-CM | POA: Diagnosis not present

## 2021-08-30 DIAGNOSIS — U071 COVID-19: Secondary | ICD-10-CM | POA: Diagnosis not present

## 2021-09-02 DIAGNOSIS — Z419 Encounter for procedure for purposes other than remedying health state, unspecified: Secondary | ICD-10-CM | POA: Diagnosis not present

## 2021-09-04 DIAGNOSIS — O10013 Pre-existing essential hypertension complicating pregnancy, third trimester: Secondary | ICD-10-CM | POA: Diagnosis not present

## 2021-09-08 DIAGNOSIS — Z3A37 37 weeks gestation of pregnancy: Secondary | ICD-10-CM | POA: Diagnosis not present

## 2021-09-08 DIAGNOSIS — O10013 Pre-existing essential hypertension complicating pregnancy, third trimester: Secondary | ICD-10-CM | POA: Diagnosis not present

## 2021-09-09 ENCOUNTER — Telehealth (HOSPITAL_COMMUNITY): Payer: Self-pay | Admitting: *Deleted

## 2021-09-09 NOTE — Telephone Encounter (Signed)
Preadmission screen  

## 2021-09-11 ENCOUNTER — Telehealth (HOSPITAL_COMMUNITY): Payer: Self-pay | Admitting: *Deleted

## 2021-09-11 DIAGNOSIS — O10013 Pre-existing essential hypertension complicating pregnancy, third trimester: Secondary | ICD-10-CM | POA: Diagnosis not present

## 2021-09-11 DIAGNOSIS — Z3A38 38 weeks gestation of pregnancy: Secondary | ICD-10-CM | POA: Diagnosis not present

## 2021-09-11 NOTE — Telephone Encounter (Signed)
Preadmission screen  

## 2021-09-14 ENCOUNTER — Encounter (HOSPITAL_COMMUNITY): Payer: Self-pay | Admitting: *Deleted

## 2021-09-14 ENCOUNTER — Telehealth (HOSPITAL_COMMUNITY): Payer: Self-pay | Admitting: *Deleted

## 2021-09-14 NOTE — Telephone Encounter (Signed)
Preadmission screen  

## 2021-09-15 DIAGNOSIS — O10013 Pre-existing essential hypertension complicating pregnancy, third trimester: Secondary | ICD-10-CM | POA: Diagnosis not present

## 2021-09-15 DIAGNOSIS — Z3A38 38 weeks gestation of pregnancy: Secondary | ICD-10-CM | POA: Diagnosis not present

## 2021-09-17 ENCOUNTER — Encounter (HOSPITAL_COMMUNITY): Payer: Self-pay | Admitting: Obstetrics and Gynecology

## 2021-09-18 ENCOUNTER — Inpatient Hospital Stay (HOSPITAL_COMMUNITY)
Admission: AD | Admit: 2021-09-18 | Discharge: 2021-09-20 | DRG: 805 | Disposition: A | Discharge status: Home / Self Care | Payer: Medicaid Other | Attending: Obstetrics and Gynecology | Admitting: Obstetrics and Gynecology

## 2021-09-18 ENCOUNTER — Other Ambulatory Visit: Payer: Self-pay

## 2021-09-18 ENCOUNTER — Inpatient Hospital Stay (HOSPITAL_COMMUNITY): Payer: Medicaid Other | Admitting: Anesthesiology

## 2021-09-18 ENCOUNTER — Encounter (HOSPITAL_COMMUNITY): Payer: Self-pay | Admitting: Obstetrics and Gynecology

## 2021-09-18 ENCOUNTER — Inpatient Hospital Stay (HOSPITAL_COMMUNITY): Payer: Medicaid Other

## 2021-09-18 DIAGNOSIS — O134 Gestational [pregnancy-induced] hypertension without significant proteinuria, complicating childbirth: Secondary | ICD-10-CM | POA: Diagnosis not present

## 2021-09-18 DIAGNOSIS — O1092 Unspecified pre-existing hypertension complicating childbirth: Secondary | ICD-10-CM | POA: Diagnosis not present

## 2021-09-18 DIAGNOSIS — O1002 Pre-existing essential hypertension complicating childbirth: Principal | ICD-10-CM | POA: Diagnosis present

## 2021-09-18 DIAGNOSIS — F419 Anxiety disorder, unspecified: Secondary | ICD-10-CM | POA: Diagnosis present

## 2021-09-18 DIAGNOSIS — O99344 Other mental disorders complicating childbirth: Secondary | ICD-10-CM | POA: Diagnosis not present

## 2021-09-18 DIAGNOSIS — O3663X0 Maternal care for excessive fetal growth, third trimester, not applicable or unspecified: Secondary | ICD-10-CM | POA: Diagnosis not present

## 2021-09-18 DIAGNOSIS — O99214 Obesity complicating childbirth: Secondary | ICD-10-CM | POA: Diagnosis present

## 2021-09-18 DIAGNOSIS — Z3A39 39 weeks gestation of pregnancy: Secondary | ICD-10-CM | POA: Diagnosis not present

## 2021-09-18 DIAGNOSIS — O4593 Premature separation of placenta, unspecified, third trimester: Secondary | ICD-10-CM | POA: Diagnosis present

## 2021-09-18 DIAGNOSIS — O411430 Placentitis, third trimester, not applicable or unspecified: Secondary | ICD-10-CM | POA: Diagnosis not present

## 2021-09-18 DIAGNOSIS — O114 Pre-existing hypertension with pre-eclampsia, complicating childbirth: Secondary | ICD-10-CM | POA: Diagnosis not present

## 2021-09-18 DIAGNOSIS — O10919 Unspecified pre-existing hypertension complicating pregnancy, unspecified trimester: Principal | ICD-10-CM

## 2021-09-18 HISTORY — DX: Fibromyalgia: M79.7

## 2021-09-18 HISTORY — DX: Irritable bowel syndrome, unspecified: K58.9

## 2021-09-18 HISTORY — DX: Depression, unspecified: F32.A

## 2021-09-18 LAB — CBC
HCT: 33.3 % — ABNORMAL LOW (ref 36.0–46.0)
HCT: 33.9 % — ABNORMAL LOW (ref 36.0–46.0)
Hemoglobin: 10.9 g/dL — ABNORMAL LOW (ref 12.0–15.0)
Hemoglobin: 11 g/dL — ABNORMAL LOW (ref 12.0–15.0)
MCH: 26.8 pg (ref 26.0–34.0)
MCH: 27.2 pg (ref 26.0–34.0)
MCHC: 32.4 g/dL (ref 30.0–36.0)
MCHC: 32.7 g/dL (ref 30.0–36.0)
MCV: 82.7 fL (ref 80.0–100.0)
MCV: 83 fL (ref 80.0–100.0)
Platelets: 282 10*3/uL (ref 150–400)
Platelets: 306 10*3/uL (ref 150–400)
RBC: 4.01 MIL/uL (ref 3.87–5.11)
RBC: 4.1 MIL/uL (ref 3.87–5.11)
RDW: 15.1 % (ref 11.5–15.5)
RDW: 15.1 % (ref 11.5–15.5)
WBC: 14.4 10*3/uL — ABNORMAL HIGH (ref 4.0–10.5)
WBC: 15.7 10*3/uL — ABNORMAL HIGH (ref 4.0–10.5)
nRBC: 0 % (ref 0.0–0.2)
nRBC: 0 % (ref 0.0–0.2)

## 2021-09-18 LAB — COMPREHENSIVE METABOLIC PANEL
ALT: 12 U/L (ref 0–44)
AST: 14 U/L — ABNORMAL LOW (ref 15–41)
Albumin: 2.3 g/dL — ABNORMAL LOW (ref 3.5–5.0)
Alkaline Phosphatase: 134 U/L — ABNORMAL HIGH (ref 38–126)
Anion gap: 9 (ref 5–15)
BUN: 7 mg/dL (ref 6–20)
CO2: 21 mmol/L — ABNORMAL LOW (ref 22–32)
Calcium: 9 mg/dL (ref 8.9–10.3)
Chloride: 105 mmol/L (ref 98–111)
Creatinine, Ser: 0.55 mg/dL (ref 0.44–1.00)
GFR, Estimated: >60 mL/min (ref >60)
Glucose, Bld: 82 mg/dL (ref 70–99)
Potassium: 4.1 mmol/L (ref 3.5–5.1)
Sodium: 135 mmol/L (ref 135–145)
Total Bilirubin: 0.2 mg/dL — ABNORMAL LOW (ref 0.3–1.2)
Total Protein: 6.4 g/dL — ABNORMAL LOW (ref 6.5–8.1)

## 2021-09-18 LAB — PROTEIN / CREATININE RATIO, URINE
Creatinine, Urine: 114.19 mg/dL
Protein Creatinine Ratio: 0.1 mg/mg{Cre} (ref 0.00–0.15)
Total Protein, Urine: 11 mg/dL

## 2021-09-18 LAB — RPR: RPR Ser Ql: NONREACTIVE

## 2021-09-18 LAB — TYPE AND SCREEN
ABO/RH(D): O POS
Antibody Screen: NEGATIVE

## 2021-09-18 MED ORDER — DIPHENHYDRAMINE HCL 50 MG/ML IJ SOLN: 12.5000 mg | INTRAMUSCULAR | Status: DC | PRN

## 2021-09-18 MED ORDER — MISOPROSTOL 200 MCG PO TABS
ORAL_TABLET | ORAL | Status: AC
  Filled 2021-09-18: qty 5

## 2021-09-18 MED ORDER — OXYCODONE-ACETAMINOPHEN 5-325 MG PO TABS: 2.0000 | ORAL_TABLET | ORAL | Status: DC | PRN

## 2021-09-18 MED ORDER — OXYCODONE-ACETAMINOPHEN 5-325 MG PO TABS: 1.0000 | ORAL_TABLET | ORAL | Status: DC | PRN

## 2021-09-18 MED ORDER — MISOPROSTOL 200 MCG PO TABS
1000.0000 ug | ORAL_TABLET | Freq: Once | ORAL | Status: AC
  Administered 2021-09-18: 1000 ug via RECTAL

## 2021-09-18 MED ORDER — OXYTOCIN BOLUS FROM INFUSION
333.0000 mL | Freq: Once | INTRAVENOUS | Status: AC
  Administered 2021-09-18: 333 mL via INTRAVENOUS

## 2021-09-18 MED ORDER — BENZOCAINE-MENTHOL 20-0.5 % EX AERO: 1.0000 "application " | INHALATION_SPRAY | CUTANEOUS | Status: DC | PRN

## 2021-09-18 MED ORDER — LIDOCAINE HCL (PF) 1 % IJ SOLN: 30.0000 mL | INTRAMUSCULAR | Status: DC | PRN

## 2021-09-18 MED ORDER — TERBUTALINE SULFATE 1 MG/ML IJ SOLN: 0.2500 mg | Freq: Once | INTRAMUSCULAR | Status: DC | PRN

## 2021-09-18 MED ORDER — PRENATAL MULTIVITAMIN CH
1.0000 | ORAL_TABLET | Freq: Every day | ORAL | Status: DC
  Administered 2021-09-19: 1 via ORAL
  Filled 2021-09-18: qty 1

## 2021-09-18 MED ORDER — PHENYLEPHRINE 40 MCG/ML (10ML) SYRINGE FOR IV PUSH (FOR BLOOD PRESSURE SUPPORT): 80.0000 ug | PREFILLED_SYRINGE | INTRAVENOUS | Status: DC | PRN

## 2021-09-18 MED ORDER — DOCUSATE SODIUM 100 MG PO CAPS
100.0000 mg | ORAL_CAPSULE | Freq: Two times a day (BID) | ORAL | Status: DC
  Administered 2021-09-19 – 2021-09-20 (×3): 100 mg via ORAL
  Filled 2021-09-18 (×3): qty 1

## 2021-09-18 MED ORDER — SOD CITRATE-CITRIC ACID 500-334 MG/5ML PO SOLN: 30.0000 mL | ORAL | Status: DC | PRN

## 2021-09-18 MED ORDER — LABETALOL HCL 100 MG PO TABS
100.0000 mg | ORAL_TABLET | Freq: Two times a day (BID) | ORAL | Status: DC
  Administered 2021-09-18 (×2): 100 mg via ORAL
  Filled 2021-09-18 (×2): qty 1

## 2021-09-18 MED ORDER — TRANEXAMIC ACID-NACL 1000-0.7 MG/100ML-% IV SOLN
INTRAVENOUS | Status: AC
Start: 2021-09-18 — End: 2021-09-19
  Filled 2021-09-18: qty 100

## 2021-09-18 MED ORDER — ONDANSETRON HCL 4 MG/2ML IJ SOLN: 4.0000 mg | INTRAMUSCULAR | Status: DC | PRN

## 2021-09-18 MED ORDER — LIDOCAINE HCL (PF) 1 % IJ SOLN
INTRAMUSCULAR | Status: DC | PRN
  Administered 2021-09-18 (×2): 4 mL via EPIDURAL

## 2021-09-18 MED ORDER — COCONUT OIL OIL: 1.0000 "application " | TOPICAL_OIL | Status: DC | PRN

## 2021-09-18 MED ORDER — EPHEDRINE 5 MG/ML INJ: 10.0000 mg | INTRAVENOUS | Status: DC | PRN

## 2021-09-18 MED ORDER — OXYTOCIN 10 UNIT/ML IJ SOLN
INTRAMUSCULAR | Status: DC
Start: 2021-09-18 — End: 2021-09-18
  Filled 2021-09-18: qty 1

## 2021-09-18 MED ORDER — OXYCODONE HCL 5 MG PO TABS: 10.0000 mg | ORAL_TABLET | ORAL | Status: DC | PRN

## 2021-09-18 MED ORDER — LACTATED RINGERS IV SOLN: INTRAVENOUS | Status: DC

## 2021-09-18 MED ORDER — OXYTOCIN-SODIUM CHLORIDE 30-0.9 UT/500ML-% IV SOLN
1.0000 m[IU]/min | INTRAVENOUS | Status: DC
  Administered 2021-09-18: 8 m[IU]/min via INTRAVENOUS
  Administered 2021-09-18: 10 m[IU]/min via INTRAVENOUS

## 2021-09-18 MED ORDER — FENTANYL-BUPIVACAINE-NACL 0.5-0.125-0.9 MG/250ML-% EP SOLN
12.0000 mL/h | EPIDURAL | Status: DC | PRN
  Administered 2021-09-18: 12 mL/h via EPIDURAL
  Filled 2021-09-18: qty 250

## 2021-09-18 MED ORDER — IBUPROFEN 600 MG PO TABS
600.0000 mg | ORAL_TABLET | Freq: Four times a day (QID) | ORAL | Status: DC
  Administered 2021-09-19 – 2021-09-20 (×5): 600 mg via ORAL
  Filled 2021-09-18 (×5): qty 1

## 2021-09-18 MED ORDER — LABETALOL HCL 100 MG PO TABS
100.0000 mg | ORAL_TABLET | Freq: Two times a day (BID) | ORAL | Status: DC
  Administered 2021-09-19 – 2021-09-20 (×3): 100 mg via ORAL
  Filled 2021-09-18 (×3): qty 1

## 2021-09-18 MED ORDER — TETANUS-DIPHTH-ACELL PERTUSSIS 5-2.5-18.5 LF-MCG/0.5 IM SUSY: 0.5000 mL | PREFILLED_SYRINGE | Freq: Once | INTRAMUSCULAR | Status: DC

## 2021-09-18 MED ORDER — SERTRALINE HCL 100 MG PO TABS
150.0000 mg | ORAL_TABLET | Freq: Every day | ORAL | Status: DC
  Administered 2021-09-19 – 2021-09-20 (×2): 150 mg via ORAL
  Filled 2021-09-18 (×2): qty 1

## 2021-09-18 MED ORDER — ACETAMINOPHEN 325 MG PO TABS: 650.0000 mg | ORAL_TABLET | ORAL | Status: DC | PRN

## 2021-09-18 MED ORDER — FENTANYL CITRATE (PF) 100 MCG/2ML IJ SOLN
50.0000 ug | INTRAMUSCULAR | Status: DC | PRN
  Administered 2021-09-18 (×3): 100 ug via INTRAVENOUS
  Filled 2021-09-18 (×3): qty 2

## 2021-09-18 MED ORDER — ONDANSETRON HCL 4 MG PO TABS: 4.0000 mg | ORAL_TABLET | ORAL | Status: DC | PRN

## 2021-09-18 MED ORDER — DIBUCAINE (PERIANAL) 1 % EX OINT: 1.0000 "application " | TOPICAL_OINTMENT | CUTANEOUS | Status: DC | PRN

## 2021-09-18 MED ORDER — TRANEXAMIC ACID-NACL 1000-0.7 MG/100ML-% IV SOLN
1000.0000 mg | INTRAVENOUS | Status: AC
  Administered 2021-09-18: 1000 mg via INTRAVENOUS

## 2021-09-18 MED ORDER — LACTATED RINGERS IV SOLN: 500.0000 mL | INTRAVENOUS | Status: DC | PRN

## 2021-09-18 MED ORDER — MISOPROSTOL 25 MCG QUARTER TABLET
25.0000 ug | ORAL_TABLET | ORAL | Status: DC | PRN
  Administered 2021-09-18 (×2): 25 ug via VAGINAL
  Filled 2021-09-18 (×2): qty 1

## 2021-09-18 MED ORDER — BISACODYL 10 MG RE SUPP: 10.0000 mg | Freq: Every day | RECTAL | Status: DC | PRN

## 2021-09-18 MED ORDER — OXYTOCIN-SODIUM CHLORIDE 30-0.9 UT/500ML-% IV SOLN
1.0000 m[IU]/min | INTRAVENOUS | Status: DC
  Administered 2021-09-18: 7 m[IU]/min via INTRAVENOUS
  Administered 2021-09-18: 6 m[IU]/min via INTRAVENOUS
  Administered 2021-09-18: 1 m[IU]/min via INTRAVENOUS
  Administered 2021-09-18: 5 m[IU]/min via INTRAVENOUS
  Administered 2021-09-18: 2 m[IU]/min via INTRAVENOUS
  Administered 2021-09-18: 3 m[IU]/min via INTRAVENOUS
  Administered 2021-09-18: 4 m[IU]/min via INTRAVENOUS

## 2021-09-18 MED ORDER — LACTATED RINGERS IV SOLN
500.0000 mL | Freq: Once | INTRAVENOUS | Status: AC
  Administered 2021-09-18: 500 mL via INTRAVENOUS

## 2021-09-18 MED ORDER — ONDANSETRON HCL 4 MG/2ML IJ SOLN: 4.0000 mg | Freq: Four times a day (QID) | INTRAMUSCULAR | Status: DC | PRN

## 2021-09-18 MED ORDER — OXYTOCIN-SODIUM CHLORIDE 30-0.9 UT/500ML-% IV SOLN
2.5000 [IU]/h | INTRAVENOUS | Status: DC
  Administered 2021-09-18: 2.5 [IU]/h via INTRAVENOUS
  Filled 2021-09-18: qty 500

## 2021-09-18 MED ORDER — OXYCODONE HCL 5 MG PO TABS: 5.0000 mg | ORAL_TABLET | ORAL | Status: DC | PRN

## 2021-09-18 MED ORDER — SIMETHICONE 80 MG PO CHEW: 80.0000 mg | CHEWABLE_TABLET | ORAL | Status: DC | PRN

## 2021-09-18 MED ORDER — FLEET ENEMA 7-19 GM/118ML RE ENEM: 1.0000 | ENEMA | Freq: Every day | RECTAL | Status: DC | PRN

## 2021-09-18 MED ORDER — DIPHENHYDRAMINE HCL 25 MG PO CAPS
25.0000 mg | ORAL_CAPSULE | Freq: Four times a day (QID) | ORAL | Status: DC | PRN
Start: 2021-09-18 — End: 2021-09-20

## 2021-09-18 MED ORDER — WITCH HAZEL-GLYCERIN EX PADS: 1.0000 "application " | MEDICATED_PAD | CUTANEOUS | Status: DC | PRN

## 2021-09-18 NOTE — Progress Notes (Signed)
OB Progress Note ? ?S: comfortbale ? ? ?O: ?Today's Vitals  ? 09/18/21 0600 09/18/21 0650 09/18/21 0730 09/18/21 0839  ?BP:  135/82  132/78  ?Pulse:  90  92  ?Resp:  18    ?Temp:      ?TempSrc:      ?Weight:      ?Height:      ?PainSc: 0-No pain 0-No pain 2    ? ?Body mass index is 38.02 kg/m?. ? ?SVE 1/50/-3, anterior, mid. Foley balloon placed at 0907, filled with 60cc ? ?FHR: 130bpm, mod variability, + accels, no decels ?Toco: irritability ? ? ?A/P: 30Y G1P0 @ [redacted]w[redacted]d IOL CHTN ?1. Fetal wellbeing: cat I tracing ?2. IOL: s/p cytotec x 2, foley ballon at 0907, start pitocin at 1 ?3. CHTN: normal to mild range blood pressures since admission, admission labs within normal limits, on labetalol '100mg'$  BID at home ?4. Pain control: epidural upon patient request ?5. Chronic abruption: continuous fetal monitoring ?  ?M. MBrien Mates MD 09/18/21 10:34 AM  ? ?

## 2021-09-18 NOTE — Lactation Note (Signed)
This note was copied from a baby's chart. ?Lactation Consultation Note ? ?Patient Name: Heidi Gibbs ?Today's Date: 09/18/2021 ?Reason for consult: L&D Initial assessment ?Age:29 hours ?P1, term female infant. ?LC entered room, mom was doing skin to skin with infant. ?Mom latched infant on her left breast using the cradle hold position, infant sustained latch and BF for 7 minutes. ?Mom will attempt to latch infant on both breast during a feeding. ?Mom will continue to BF infant according to primal cues, 8 to 12 times within 24 hours, skin to skin. ?Mom will ask RN/LC on MBU for further latch assistance if needed. ?[place] congratulated parents on the birth of their son. ?Maternal Data ?  ? ?Feeding ?Mother's Current Feeding Choice: Breast Milk ? ?LATCH Score ?Latch: Grasps breast easily, tongue down, lips flanged, rhythmical sucking. ? ?Audible Swallowing: Spontaneous and intermittent ? ?Type of Nipple: Flat ? ?Comfort (Breast/Nipple): Soft / non-tender ? ?Hold (Positioning): Assistance needed to correctly position infant at breast and maintain latch. ? ?LATCH Score: 8 ? ? ?Lactation Tools Discussed/Used ?  ? ?Interventions ?Interventions: Assisted with latch;Skin to skin;Breast compression;Adjust position;Support pillows;Position options;Education ? ?Discharge ?  ? ?Consult Status ?Consult Status: Follow-up from L&D ? ? ? ?Vicente Serene ?09/18/2021, 9:23 PM ? ? ? ?

## 2021-09-18 NOTE — Progress Notes (Signed)
OB Progress Note ? ?S:Foley balloon fell out ? ? ?O: ?Today's Vitals  ? 09/18/21 1131 09/18/21 1137 09/18/21 1201 09/18/21 1234  ?BP: (!) 158/99 140/71 (!) 143/83 (!) 154/99  ?Pulse: (!) 101 95 97 (!) 117  ?Resp:      ?Temp:      ?TempSrc:      ?Weight:      ?Height:      ?PainSc:      ? ?Body mass index is 38.02 kg/m?. ? ?SVE 4.5/70/-2 ? ? ? ?A/P: A/P: 30Y G1P0 @ [redacted]w[redacted]d IOL CHTN ?1. Fetal wellbeing: cat I tracing ?2. IOL: s/p cytotec x 2, s/p foley balloon, continue pitocin, plan for AROM when fetal station lower ?3. CHTN: Bps mildly elevated since admission, admission labs within normal limits, on labetalol '100mg'$  BID ?4. Pain control: epidural upon patient request ?5. Chronic abruption: continuous fetal monitoring ? ?

## 2021-09-18 NOTE — Progress Notes (Signed)
OB Progress Note ? ?S: Pt uncomfortable with contractions but tolerating well ? ? ?O: ?Today's Vitals  ? 09/18/21 1401 09/18/21 1431 09/18/21 1502 09/18/21 1531  ?BP: 138/65 (!) 152/121 128/78 116/64  ?Pulse: (!) 101 (!) 106 88 84  ?Resp:      ?Temp:      ?TempSrc:      ?Weight:      ?Height:      ?PainSc:      ? ?Body mass index is 38.02 kg/m?. ? ?SVE 6/70/-1, AROM light meconium ? ?A/P: ?A/P: A/P: 30Y G1P0 @ [redacted]w[redacted]d IOL CHTN ?1. Fetal wellbeing: cat I tracing ?2. IOL: s/p cytotec x 2, s/p foley balloon, continue pitocin, plan for AROM when fetal station lower ?3. CHTN: Bps normal to mildly elevated since admission, admission labs within normal limits, on labetalol '100mg'$  BID ?4. Pain control: pt undecided about epidural, coping with contractions well ?5. Chronic abruption: continuous fetal monitoring ? ?M. MBrien Mates MD 09/18/21 4:35 PM  ?

## 2021-09-18 NOTE — Anesthesia Procedure Notes (Signed)
Epidural ?Patient location during procedure: OB ?Start time: 09/18/2021 6:14 PM ?End time: 09/18/2021 6:17 PM ? ?Staffing ?Anesthesiologist: Brennan Bailey, MD ?Performed: anesthesiologist  ? ?Preanesthetic Checklist ?Completed: patient identified, IV checked, risks and benefits discussed, monitors and equipment checked, pre-op evaluation and timeout performed ? ?Epidural ?Patient position: sitting ?Prep: DuraPrep and site prepped and draped ?Patient monitoring: continuous pulse ox, blood pressure and heart rate ?Approach: midline ?Location: L3-L4 ?Injection technique: LOR air ? ?Needle:  ?Needle type: Tuohy  ?Needle gauge: 17 G ?Needle length: 9 cm ?Needle insertion depth: 6 cm ?Catheter type: closed end flexible ?Catheter size: 19 Gauge ?Catheter at skin depth: 11 cm ?Test dose: negative and Other (1% lidocaine) ? ?Assessment ?Events: blood not aspirated, injection not painful, no injection resistance, no paresthesia and negative IV test ? ?Additional Notes ?Patient identified. Risks, benefits, and alternatives discussed with patient including but not limited to bleeding, infection, nerve damage, paralysis, failed block, incomplete pain control, headache, blood pressure changes, nausea, vomiting, reactions to medication, itching, and postpartum back pain. Confirmed with bedside nurse the patient's most recent platelet count. Confirmed with patient that they are not currently taking any anticoagulation, have any bleeding history, or any family history of bleeding disorders. Patient expressed understanding and wished to proceed. All questions were answered. Sterile technique was used throughout the entire procedure. Please see nursing notes for vital signs.  ? ?Crisp LOR on first pass. Test dose was given through epidural catheter and negative prior to continuing to dose epidural or start infusion. Warning signs of high block given to the patient including shortness of breath, tingling/numbness in hands, complete  motor block, or any concerning symptoms with instructions to call for help. Patient was given instructions on fall risk and not to get out of bed. All questions and concerns addressed with instructions to call with any issues or inadequate analgesia.  Reason for block:procedure for pain ? ? ? ?

## 2021-09-18 NOTE — Anesthesia Preprocedure Evaluation (Signed)
Anesthesia Evaluation  ?Patient identified by MRN, date of birth, ID band ?Patient awake ? ? ? ?Reviewed: ?Allergy & Precautions, Patient's Chart, lab work & pertinent test results ? ?History of Anesthesia Complications ?Negative for: history of anesthetic complications ? ?Airway ?Mallampati: II ? ?TM Distance: >3 FB ?Neck ROM: Full ? ? ? Dental ?no notable dental hx. ? ?  ?Pulmonary ?neg pulmonary ROS,  ?  ?Pulmonary exam normal ? ? ? ? ? ? ? Cardiovascular ?hypertension (gestational), Normal cardiovascular exam ? ? ?  ?Neuro/Psych ? Headaches, Anxiety Depression Idiopathic intracranial hypertension, hx of lumbar discectomy ?  ? GI/Hepatic ?Neg liver ROS, GERD  ,  ?Endo/Other  ?negative endocrine ROS ? Renal/GU ?negative Renal ROS  ?negative genitourinary ?  ?Musculoskeletal ? ?(+) Fibromyalgia - ? Abdominal ?  ?Peds ? Hematology ?negative hematology ROS ?(+)   ?Anesthesia Other Findings ?Day of surgery medications reviewed with patient. ? Reproductive/Obstetrics ?(+) Pregnancy ? ?  ? ? ? ? ? ? ? ? ? ? ? ? ? ?  ?  ? ? ? ? ? ? ? ? ?Anesthesia Physical ?Anesthesia Plan ? ?ASA: 2 ? ?Anesthesia Plan: Epidural  ? ?Post-op Pain Management:   ? ?Induction:  ? ?PONV Risk Score and Plan: Treatment may vary due to age or medical condition ? ?Airway Management Planned: Natural Airway ? ?Additional Equipment: Fetal Monitoring ? ?Intra-op Plan:  ? ?Post-operative Plan:  ? ?Informed Consent: I have reviewed the patients History and Physical, chart, labs and discussed the procedure including the risks, benefits and alternatives for the proposed anesthesia with the patient or authorized representative who has indicated his/her understanding and acceptance.  ? ? ? ? ? ?Plan Discussed with:  ? ?Anesthesia Plan Comments:   ? ? ? ? ? ? ?Anesthesia Quick Evaluation ? ?

## 2021-09-18 NOTE — H&P (Signed)
Heidi Gibbs is a 31 y.o. female G1P0000 88w0dpresenting for IOL for chronic hypertension. She has received 2 doses of vaginal cytotec, last dose at 0507. She reports contractions. No LOF or VB. Good FM ? ?Pregnancy c/b: ?Chronic hypertension: was not on antihypertensives at the beginning of the pregnancy, started labetalol '100mg'$  BID on 07/31/21. Baseline labs within normal limits.  ?Placental abruption: retroplacental collection noted incidentally on 32 weeks growth [place] has been asymptomatic. Most recent growth at 36 weeks, EFW 53% 6lb6oz) ?Obesity: initial BMI 37, has been on ASA.  ?History of benign intracranial hypertension, asymptomatic ?Anxiety: stable on sertraline '150mg'$  daily ? ?OB History   ? ? Gravida  ?1  ? Para  ?0  ? Term  ?0  ? Preterm  ?0  ? AB  ?0  ? Living  ?0  ?  ? ? SAB  ?0  ? IAB  ?0  ? Ectopic  ?0  ? Multiple  ?0  ? Live Births  ?0  ?   ?  ?  ? ?Past Medical History:  ?Diagnosis Date  ? [place]   ? DDD (degenerative disc disease), lumbar   ? Depression   ? Fibromyalgia   ? Generalized anxiety disorder   ? IBS (irritable bowel syndrome)   ? Insomnia   ? Migraines   ? Placental abruption   ? 32 weeks  ? Pregnancy induced hypertension   ? ?Past Surgical History:  ?Procedure Laterality Date  ? [place]    [place]  ? WISDOM TOOTH EXTRACTION    ? ?Family History: family history includes Diabetes in her father and mother; Hypertension in her mother. ?Social History:  reports that she has never smoked. She has never used smokeless tobacco. She reports that she does not drink alcohol and does not use drugs. ? ? ?  ?Maternal Diabetes: No ?Genetic Screening: Declined ?Maternal Ultrasounds/Referrals: Normal ?Fetal Ultrasounds or other Referrals:  chronic placental abruption ?Maternal Substance Abuse:  No ?Significant Maternal Medications:  Meds include: Zoloft ?Significant Maternal Lab Results:  Group B Strep negative ?Other Comments:  None ? ?Review  of Systems Per HPI ?Exam ?Physical Exam  ?Dilation: 1 ?Effacement (%): Thick ?Station: -2 ?Exam by:: YCarin Primrose RN ?Blood pressure 135/82, pulse 90, temperature 98.3 ?F (36.8 ?C), temperature source Oral, resp. rate 18, height '5\' 10"'$  (1.778 m), weight 120.2 kg. ?Gen: NAD, resting comfortably ?CVS: normal pulses ?Lungs: nonlabored respirations\ ?Abd: Gravid abdomen, Leopolds 7# ?Ext: no calf edema or tenderness ? ?Fetal testing: 125bpm, mod variability, + accels, no decels ?Toco: irritability ?Prenatal labs: ?ABO, Rh:  ?--/--/O POS (03/17 0026) ?Antibody: NEG (03/17 0026) ?Rubella: Immune (09/22 0000) ?RPR: NON REACTIVE (01/27 1156)  ?HBsAg: Negative (09/22 0000)  ?HIV: Non-reactive (09/22 0000)  ?GBS: Negative/-- (02/24 0000)  ? ?Assessment/Plan: ?30Y G1P0 @ 349w0dIOL CHTN ?1. Fetal wellbeing: cat I tracing ?2. IOL: s/p cytotec x 2, continue q4hr PRN ripening, consider foley with next exam ?3. CHTN: normal to mild range blood pressures since admission, admission labs within normal limits, on labetalol '100mg'$  BID at home ?4. Pain control: epidural upon patient request ?5. Chronic abruption: continuous fetal monitoring ? ?M. MaBrien MatesMD 09/18/21 8:14 AM   ? ? ? ? ? ?

## 2021-09-19 LAB — CBC
HCT: 30.1 % — ABNORMAL LOW (ref 36.0–46.0)
Hemoglobin: 9.6 g/dL — ABNORMAL LOW (ref 12.0–15.0)
MCH: 26.5 pg (ref 26.0–34.0)
MCHC: 31.9 g/dL (ref 30.0–36.0)
MCV: 83.1 fL (ref 80.0–100.0)
Platelets: 264 10*3/uL (ref 150–400)
RBC: 3.62 MIL/uL — ABNORMAL LOW (ref 3.87–5.11)
RDW: 15.1 % (ref 11.5–15.5)
WBC: 19.7 10*3/uL — ABNORMAL HIGH (ref 4.0–10.5)
nRBC: 0 % (ref 0.0–0.2)

## 2021-09-19 NOTE — Progress Notes (Signed)
MOB was referred for history of depression/anxiety. ?* Referral screened out by Clinical Social Worker because none of the following criteria appear to apply: ?~ History of anxiety/depression during this pregnancy, or of post-partum depression following prior delivery. ?~ Diagnosis of anxiety and/or depression within last 3 years ?OR ?* MOB's symptoms currently being treated with medication and/or therapy. MOB has an active Rx for Zoloft.  No concerns were noted in MOB's [facility] records.  ? ?Please contact the Clinical Social Worker if needs arise, by [facility] request, or if MOB scores greater than 9/yes to question 10 on Edinburgh Postpartum Depression Screen.   ? ?Laurey Arrow, MSW, LCSW ?Clinical Social Work ?[phone] ? ?

## 2021-09-19 NOTE — Lactation Note (Signed)
This note was copied from a baby's chart. ?Lactation Consultation Note ? ?Patient Name: Heidi Gibbs ?Today's Date: 09/19/2021 ?Reason for consult: Initial assessment;Primapara;1st time breastfeeding;Term ?Age:29 hours ?Baby awake after the RN assessment. LC offered to assist to latch and mom receptive. LC noted semi compressible areolas and with reverse pressure more compressible so baby could latch with depth for 5-6 strong sucks / release, and back on for 5-6 strong sucks. When baby re-latched each time the suck was stronger. Baby STS on moms chest after latch. Latch score 6 .  ?Baby has been supplemented with formula and may not be overly hungry even though has had stooled x 5 .  ?Mom aware to call for BF assist.  ? ?Maternal Data ?  ? ?Feeding ?Mother's Current Feeding Choice: Breast Milk and Formula ? ?LATCH Score ?Latch: Repeated attempts needed to sustain latch, nipple held in mouth throughout feeding, stimulation needed to elicit sucking reflex. ? ?Audible Swallowing: None ? ?Type of Nipple: Everted at rest and after stimulation (areola semi compressible and with reverse pressure more compressible) ? ?Comfort (Breast/Nipple): Soft / non-tender ? ?Hold (Positioning): Assistance needed to correctly position infant at breast and maintain latch. ? ?LATCH Score: 6 ? ? ?Lactation Tools Discussed/Used ?  ? ?Interventions ?Interventions: Breast feeding basics reviewed;Assisted with latch;Skin to skin;Breast massage;Hand express;Reverse pressure;Breast compression;Adjust position;Support pillows;Position options ? ?Discharge ?Pump: DEBP;Hands Free ? ?Consult Status ?Consult Status: Follow-up ?Date: 09/19/21 ?Follow-up type: In-patient ? ? ? ?Jerlyn Ly Aalliyah Kilker ?09/19/2021, 9:13 AM ? ? ? ?

## 2021-09-19 NOTE — Progress Notes (Signed)
Patient is eating, ambulating, voiding.  Pain control is good.  Appropriate lochia, no complaints. ? ?Vitals:  ? 09/18/21 2315 09/19/21 0025 09/19/21 0531 09/19/21 0830  ?BP: 114/77 132/74 134/77 132/62  ?Pulse: (!) 118 (!) 104 94 93  ?Resp: '18 18 18 18  '$ ?Temp: 99.7 ?F (37.6 ?C) 98.5 ?F (36.9 ?C) 98.2 ?F (36.8 ?C) 98.1 ?F (36.7 ?C)  ?TempSrc: Oral Oral Oral Oral  ?SpO2: 99% 100% 100% 98%  ?Weight:      ?Height:      ? ? ?Fundus firm ?ABd: nontender ?Ext: no calf tenderness ? ?Lab Results  ?Component Value Date  ? WBC 19.7 (H) 09/19/2021  ? HGB 9.6 (L) 09/19/2021  ? HCT 30.1 (L) 09/19/2021  ? MCV 83.1 09/19/2021  ? PLT 264 09/19/2021  ? ? ?--/--/O POS (03/17 0026) ? ?A/P Post partum day 1. ?CHTN, intracranial hypertension on labetalol '100mg'$  bid- BPs currently normal, last mild range BP yesterday at 21:05, last severe BP yesterday at 20:30 ?S/p PPH requiring cytotec and TXA with resolution. ?Desired circ, risks/benefits/complications/ elective nature discussed and consent obtained. ? Routine care.  Expect d/c 3/19.   ? ?Allyn Kenner ?  ?

## 2021-09-19 NOTE — Anesthesia Postprocedure Evaluation (Signed)
Anesthesia Post Note ? ?Patient: CHLORA MCBAIN ? ?Procedure(s) Performed: AN AD HOC LABOR EPIDURAL ? ?  ? ?Patient location during evaluation: Mother Baby ?Anesthesia Type: Epidural ?Level of consciousness: awake and alert and oriented ?Pain management: satisfactory to patient ?Vital Signs Assessment: post-procedure vital signs reviewed and stable ?Respiratory status: respiratory function stable ?Cardiovascular status: stable ?Postop Assessment: no headache, no backache, epidural receding, patient able to bend at knees, no signs of nausea or vomiting, adequate PO intake and able to ambulate ?Anesthetic complications: no ? ? ?No notable events documented. ? ?Last Vitals:  ?Vitals:  ? 09/19/21 0025 09/19/21 0531  ?BP: 132/74 134/77  ?Pulse: (!) 104 94  ?Resp: 18 18  ?Temp: 36.9 ?C 36.8 ?C  ?SpO2: 100% 100%  ?  ?Last Pain:  ?Vitals:  ? 09/19/21 0715  ?TempSrc:   ?PainSc: 0-No pain  ? ?Pain Goal:   ? ?  ?  ?  ?  ?  ?  ?  ? ?Kazumi Lachney ? ? ? ? ?

## 2021-09-20 NOTE — Discharge Summary (Signed)
? ?  Postpartum Discharge Summary ? ?   ?Patient Name: Heidi Gibbs ?DOB: 07/28/1990 ?MRN: [id number] ? ?Date of admission: 09/18/2021 ?Delivery date:09/18/2021  ?Delivering provider: Irene Pap E  ?Date of discharge: 09/20/2021 ? ?Admitting diagnosis: Chronic hypertension affecting pregnancy [O10.919] ?Intrauterine pregnancy: [redacted]w[redacted]d    ?Secondary diagnosis:  Principal Problem: ?  Chronic hypertension affecting pregnancy ? ?Additional problems: retroplacental abruption at 32 weeks    ?Discharge diagnosis: Term Pregnancy Delivered and CHTN                                              ?Post partum procedures: none ?Augmentation: AROM, Pitocin, Cytotec, and IP Foley ?Complications: None ? ?Hospital course: Induction of Labor With Vaginal Delivery   ?31y.o. yo G1P1001 at 352w0das admitted to the hospital 09/18/2021 for induction of labor.  Indication for induction:  CHTN .  Patient had an uncomplicated labor course as follows: ?Membrane Rupture Time/Date: 4:30 PM ,09/18/2021   ?Delivery Method:Vaginal, Spontaneous  ?Episiotomy: None  ?Lacerations:  None  ?Details of delivery can be found in separate delivery note.  Patient had a routine postpartum course. Patient is discharged home 09/20/21. ? ?Newborn Data: ?Birth date:09/18/2021  ?Birth time:8:10 PM  ?Gender:Female  ?Living status:Living  ?Apgars:6 ,7  ?Weight:3330 g  ? ?Magnesium Sulfate received: No ?BMZ received: No ?Rhophylac:N/A ?MMR:N/A ?T-DaP: see office note ?Flu: N/A ?Transfusion:No ? ?Physical exam  ?Vitals:  ? 09/19/21 0830 09/19/21 1234 09/19/21 2151 09/20/21 0540  ?BP: 132/62 126/73  118/70  ?Pulse: 93 93 85 (!) 107  ?Resp: 18 18 18 18   ?Temp: 98.1 ?F (36.7 ?C) 98.7 ?F (37.1 ?C) 99 ?F (37.2 ?C) 98.1 ?F (36.7 ?C)  ?TempSrc: Oral Oral Oral Oral  ?SpO2: 98% 96% 100% 100%  ?Weight:      ?Height:      ? ?General: alert, cooperative, and no distress ?Lochia: appropriate ?Uterine Fundus: firm ?Incision: N/A ?DVT Evaluation: No evidence of DVT seen on  physical exam. ?Labs: ?Lab Results  ?Component Value Date  ? WBC 19.7 (H) 09/19/2021  ? HGB 9.6 (L) 09/19/2021  ? HCT 30.1 (L) 09/19/2021  ? MCV 83.1 09/19/2021  ? PLT 264 09/19/2021  ? ?CMP Latest Ref Rng & Units 09/18/2021  ?Glucose 70 - 99 mg/dL 82  ?BUN 6 - 20 mg/dL 7  ?Creatinine 0.44 - 1.00 mg/dL 0.55  ?Sodium 135 - 145 mmol/L 135  ?Potassium 3.5 - 5.1 mmol/L 4.1  ?Chloride 98 - 111 mmol/L 105  ?CO2 22 - 32 mmol/L 21(L)  ?Calcium 8.9 - 10.3 mg/dL 9.0  ?Total Protein 6.5 - 8.1 g/dL 6.4(L)  ?Total Bilirubin 0.3 - 1.2 mg/dL 0.2(L)  ?Alkaline Phos 38 - 126 U/L 134(H)  ?AST 15 - 41 U/L 14(L)  ?ALT 0 - 44 U/L 12  ? ?Edinburgh Score: ?Edinburgh Postnatal Depression Scale Screening Tool 09/19/2021  ?I have been able to laugh and see the funny side of things. (No Data)  ? ? ? ? ?After visit meds:  ?Allergies as of 09/20/2021   ? ?   Reactions  ? Erythromycin Hives  ? Penicillins Nausea And Vomiting  ? Has patient had a PCN reaction causing immediate rash, facial/tongue/throat swelling, SOB or lightheadedness with hypotension: No ?Has patient had a PCN reaction causing severe rash involving mucus membranes or skin necrosis: No ?Has patient had a PCN reaction that  required hospitalization No ?Has patient had a PCN reaction occurring within the last 10 years: Yes ?If all of the above answers are "NO", then may proceed with Cephalosporin use.  ? Tamiflu [oseltamivir Phosphate] Nausea And Vomiting  ? Clindamycin Hives  ? Oseltamivir Nausea And Vomiting  ? Latex Hives, Rash  ? ?  ? ?  ?Medication List  ?  ? ?STOP taking these medications   ? ?aspirin EC 81 MG tablet ?  ? ?  ? ?TAKE these medications   ? ?docusate sodium 100 MG capsule ?Commonly known as: COLACE ?Take 100 mg by mouth daily as needed for mild constipation. ?  ?labetalol 100 MG tablet ?Commonly known as: NORMODYNE ?Take 1 tablet (100 mg total) by mouth 2 (two) times daily. ?  ?multivitamin-prenatal 27-0.8 MG Tabs tablet ?Take 1 tablet by mouth daily at 12 noon. ?   ?sertraline 100 MG tablet ?Commonly known as: ZOLOFT ?Take 150 mg by mouth daily. ?  ? ?  ? ? ? ?Discharge home in stable condition ?Infant Feeding:  see peds note ?Infant Disposition:home with mother ?Discharge instruction: per After Visit Summary and Postpartum booklet. ?Activity: Advance as tolerated. Pelvic rest for 6 weeks.  ?Diet: routine diet ?Anticipated Birth Control: Unsure ?Postpartum Appointment:1 week ?Additional Postpartum F/U: Postpartum Depression checkup and BP check next week ?Future Appointments:No future appointments. ?Follow up Visit: ? Follow-up Information   ? ? Rowland Lathe, MD. Call.   ?Specialty: Obstetrics and Gynecology ?Why: follow up for in office BP check next week, can also schedule 4 week postpartum visit ?Contact information: ?[address] ?[address] ?[place] [address] ?[phone] ? ? ?  ?  ? ?  ?  ? ?  ? ? ? ?  ? ?09/20/2021 ?Allyn Kenner, DO ? ? ?

## 2021-09-22 LAB — SURGICAL PATHOLOGY

## 2021-09-28 ENCOUNTER — Telehealth (HOSPITAL_COMMUNITY): Payer: Self-pay | Admitting: *Deleted

## 2021-09-28 NOTE — Telephone Encounter (Signed)
Attempted hospital discharge follow-up call. Left message for patient to return RN call. Erline Levine, RN, 09/28/21, [phone] ?

## 2021-10-03 DIAGNOSIS — Z419 Encounter for procedure for purposes other than remedying health state, unspecified: Secondary | ICD-10-CM | POA: Diagnosis not present

## 2021-11-02 DIAGNOSIS — Z419 Encounter for procedure for purposes other than remedying health state, unspecified: Secondary | ICD-10-CM | POA: Diagnosis not present

## 2021-12-03 DIAGNOSIS — Z419 Encounter for procedure for purposes other than remedying health state, unspecified: Secondary | ICD-10-CM | POA: Diagnosis not present

## 2022-01-02 DIAGNOSIS — Z419 Encounter for procedure for purposes other than remedying health state, unspecified: Secondary | ICD-10-CM | POA: Diagnosis not present

## 2022-02-02 DIAGNOSIS — Z419 Encounter for procedure for purposes other than remedying health state, unspecified: Secondary | ICD-10-CM | POA: Diagnosis not present

## 2022-03-05 DIAGNOSIS — Z419 Encounter for procedure for purposes other than remedying health state, unspecified: Secondary | ICD-10-CM | POA: Diagnosis not present

## 2022-03-15 DIAGNOSIS — Z03818 Encounter for observation for suspected exposure to other biological agents ruled out: Secondary | ICD-10-CM | POA: Diagnosis not present

## 2022-03-15 DIAGNOSIS — R0981 Nasal congestion: Secondary | ICD-10-CM | POA: Diagnosis not present

## 2022-03-15 DIAGNOSIS — Z6838 Body mass index (BMI) 38.0-38.9, adult: Secondary | ICD-10-CM | POA: Diagnosis not present

## 2022-03-15 DIAGNOSIS — R051 Acute cough: Secondary | ICD-10-CM | POA: Diagnosis not present

## 2022-03-15 DIAGNOSIS — J0100 Acute maxillary sinusitis, unspecified: Secondary | ICD-10-CM | POA: Diagnosis not present

## 2022-04-04 DIAGNOSIS — Z419 Encounter for procedure for purposes other than remedying health state, unspecified: Secondary | ICD-10-CM | POA: Diagnosis not present

## 2022-05-05 DIAGNOSIS — Z419 Encounter for procedure for purposes other than remedying health state, unspecified: Secondary | ICD-10-CM | POA: Diagnosis not present

## 2022-06-04 DIAGNOSIS — Z419 Encounter for procedure for purposes other than remedying health state, unspecified: Secondary | ICD-10-CM | POA: Diagnosis not present

## 2022-07-05 DIAGNOSIS — Z419 Encounter for procedure for purposes other than remedying health state, unspecified: Secondary | ICD-10-CM | POA: Diagnosis not present

## 2022-08-04 DIAGNOSIS — Z0000 Encounter for general adult medical examination without abnormal findings: Secondary | ICD-10-CM | POA: Diagnosis not present

## 2022-08-04 DIAGNOSIS — Z124 Encounter for screening for malignant neoplasm of cervix: Secondary | ICD-10-CM | POA: Diagnosis not present

## 2022-08-05 DIAGNOSIS — Z419 Encounter for procedure for purposes other than remedying health state, unspecified: Secondary | ICD-10-CM | POA: Diagnosis not present

## 2022-09-03 DIAGNOSIS — Z419 Encounter for procedure for purposes other than remedying health state, unspecified: Secondary | ICD-10-CM | POA: Diagnosis not present

## 2022-10-04 DIAGNOSIS — Z419 Encounter for procedure for purposes other than remedying health state, unspecified: Secondary | ICD-10-CM | POA: Diagnosis not present

## 2022-11-03 DIAGNOSIS — Z419 Encounter for procedure for purposes other than remedying health state, unspecified: Secondary | ICD-10-CM | POA: Diagnosis not present

## 2022-11-10 DIAGNOSIS — Z6841 Body Mass Index (BMI) 40.0 and over, adult: Secondary | ICD-10-CM | POA: Diagnosis not present

## 2022-11-10 DIAGNOSIS — J209 Acute bronchitis, unspecified: Secondary | ICD-10-CM | POA: Diagnosis not present

## 2022-12-04 DIAGNOSIS — Z419 Encounter for procedure for purposes other than remedying health state, unspecified: Secondary | ICD-10-CM | POA: Diagnosis not present

## 2023-01-03 DIAGNOSIS — Z419 Encounter for procedure for purposes other than remedying health state, unspecified: Secondary | ICD-10-CM | POA: Diagnosis not present

## 2023-02-03 DIAGNOSIS — Z419 Encounter for procedure for purposes other than remedying health state, unspecified: Secondary | ICD-10-CM | POA: Diagnosis not present

## 2023-03-06 DIAGNOSIS — Z419 Encounter for procedure for purposes other than remedying health state, unspecified: Secondary | ICD-10-CM | POA: Diagnosis not present

## 2023-04-05 DIAGNOSIS — Z419 Encounter for procedure for purposes other than remedying health state, unspecified: Secondary | ICD-10-CM | POA: Diagnosis not present

## 2023-05-06 DIAGNOSIS — J069 Acute upper respiratory infection, unspecified: Secondary | ICD-10-CM | POA: Diagnosis not present

## 2023-05-06 DIAGNOSIS — Z419 Encounter for procedure for purposes other than remedying health state, unspecified: Secondary | ICD-10-CM | POA: Diagnosis not present

## 2023-06-05 DIAGNOSIS — Z419 Encounter for procedure for purposes other than remedying health state, unspecified: Secondary | ICD-10-CM | POA: Diagnosis not present

## 2023-06-14 DIAGNOSIS — Z3009 Encounter for other general counseling and advice on contraception: Secondary | ICD-10-CM | POA: Diagnosis not present

## 2023-06-14 DIAGNOSIS — E282 Polycystic ovarian syndrome: Secondary | ICD-10-CM | POA: Diagnosis not present

## 2023-07-06 DIAGNOSIS — Z419 Encounter for procedure for purposes other than remedying health state, unspecified: Secondary | ICD-10-CM | POA: Diagnosis not present

## 2023-07-13 DIAGNOSIS — N926 Irregular menstruation, unspecified: Secondary | ICD-10-CM | POA: Diagnosis not present

## 2023-07-13 DIAGNOSIS — E282 Polycystic ovarian syndrome: Secondary | ICD-10-CM | POA: Diagnosis not present

## 2023-08-06 DIAGNOSIS — Z419 Encounter for procedure for purposes other than remedying health state, unspecified: Secondary | ICD-10-CM | POA: Diagnosis not present

## 2023-09-03 DIAGNOSIS — Z419 Encounter for procedure for purposes other than remedying health state, unspecified: Secondary | ICD-10-CM | POA: Diagnosis not present

## 2023-09-06 DIAGNOSIS — Z124 Encounter for screening for malignant neoplasm of cervix: Secondary | ICD-10-CM | POA: Diagnosis not present

## 2023-09-06 DIAGNOSIS — Z0000 Encounter for general adult medical examination without abnormal findings: Secondary | ICD-10-CM | POA: Diagnosis not present

## 2023-09-21 ENCOUNTER — Encounter (HOSPITAL_COMMUNITY): Payer: Self-pay | Admitting: Obstetrics and Gynecology

## 2023-09-21 NOTE — Progress Notes (Signed)
 Spoke w/ via phone for pre-op interview--- Heidi Gibbs Lab needs dos----  UPT per anesthesia. Surgeon orders requested 09/21/23.       Lab results------ COVID test -----patient states asymptomatic no test needed Arrive at -------1015 NPO after MN NO Solid Food.  Clear liquids from MN until---0915 Pre-Surgery Ensure or G2:  Med rec completed Medications to take morning of surgery -----NONE Diabetic medication -----  GLP1 agonist last dose: GLP1 instructions:  Patient instructed no nail polish to be worn day of surgery Patient instructed to bring photo id and insurance card day of surgery Patient aware to have Driver (ride ) / caregiver    for 24 hours after surgery - Boyfriend Seth Bake Patient Special Instructions ----- Shower with antibacterial soap. Pre-Op special Instructions -----  Patient verbalized understanding of instructions that were given at this phone interview. Patient denies chest pain, sob, fever, cough at the interview.

## 2023-10-03 ENCOUNTER — Ambulatory Visit (HOSPITAL_BASED_OUTPATIENT_CLINIC_OR_DEPARTMENT_OTHER): Payer: Self-pay

## 2023-10-03 ENCOUNTER — Encounter (HOSPITAL_COMMUNITY): Payer: Self-pay | Admitting: Obstetrics and Gynecology

## 2023-10-03 ENCOUNTER — Other Ambulatory Visit: Payer: Self-pay

## 2023-10-03 ENCOUNTER — Encounter (HOSPITAL_COMMUNITY)
Admission: RE | Disposition: A | Discharge status: Home / Self Care | Payer: Self-pay | Source: Home / Self Care | Attending: Obstetrics and Gynecology

## 2023-10-03 ENCOUNTER — Ambulatory Visit (HOSPITAL_COMMUNITY): Payer: Self-pay

## 2023-10-03 ENCOUNTER — Ambulatory Visit (HOSPITAL_COMMUNITY)
Admission: RE | Admit: 2023-10-03 | Discharge: 2023-10-03 | Disposition: A | Discharge status: Home / Self Care | Payer: Medicaid Other | Attending: Obstetrics and Gynecology | Admitting: Obstetrics and Gynecology

## 2023-10-03 ENCOUNTER — Ambulatory Visit: Admit: 2023-10-03 | Payer: Medicaid Other | Admitting: Obstetrics and Gynecology

## 2023-10-03 DIAGNOSIS — Z01818 Encounter for other preprocedural examination: Secondary | ICD-10-CM

## 2023-10-03 DIAGNOSIS — R519 Headache, unspecified: Secondary | ICD-10-CM | POA: Insufficient documentation

## 2023-10-03 DIAGNOSIS — F419 Anxiety disorder, unspecified: Secondary | ICD-10-CM | POA: Insufficient documentation

## 2023-10-03 DIAGNOSIS — Z302 Encounter for sterilization: Secondary | ICD-10-CM | POA: Diagnosis not present

## 2023-10-03 DIAGNOSIS — M797 Fibromyalgia: Secondary | ICD-10-CM | POA: Diagnosis not present

## 2023-10-03 DIAGNOSIS — K219 Gastro-esophageal reflux disease without esophagitis: Secondary | ICD-10-CM | POA: Diagnosis not present

## 2023-10-03 DIAGNOSIS — I10 Essential (primary) hypertension: Secondary | ICD-10-CM

## 2023-10-03 DIAGNOSIS — F32A Depression, unspecified: Secondary | ICD-10-CM | POA: Diagnosis not present

## 2023-10-03 DIAGNOSIS — F418 Other specified anxiety disorders: Secondary | ICD-10-CM

## 2023-10-03 HISTORY — PX: LAPAROSCOPIC BILATERAL SALPINGECTOMY: SHX5889

## 2023-10-03 LAB — CBC
HCT: 42.1 % (ref 36.0–46.0)
Hemoglobin: 13.8 g/dL (ref 12.0–15.0)
MCH: 27.4 pg (ref 26.0–34.0)
MCHC: 32.8 g/dL (ref 30.0–36.0)
MCV: 83.5 fL (ref 80.0–100.0)
Platelets: 326 10*3/uL (ref 150–400)
RBC: 5.04 MIL/uL (ref 3.87–5.11)
RDW: 13.2 % (ref 11.5–15.5)
WBC: 8.6 10*3/uL (ref 4.0–10.5)
nRBC: 0 % (ref 0.0–0.2)

## 2023-10-03 LAB — POCT PREGNANCY, URINE: Preg Test, Ur: NEGATIVE

## 2023-10-03 LAB — TYPE AND SCREEN
ABO/RH(D): O POS
Antibody Screen: NEGATIVE

## 2023-10-03 SURGERY — SALPINGECTOMY, BILATERAL, LAPAROSCOPIC
Anesthesia: General | Site: Uterus | Laterality: Bilateral

## 2023-10-03 SURGERY — SALPINGECTOMY, BILATERAL, LAPAROSCOPIC
Anesthesia: General | Laterality: Bilateral

## 2023-10-03 MED ORDER — FENTANYL CITRATE (PF) 100 MCG/2ML IJ SOLN
INTRAMUSCULAR | Status: DC | PRN
Start: 2023-10-03 — End: 2023-10-03
  Administered 2023-10-03 (×2): 50 ug via INTRAVENOUS
  Administered 2023-10-03: 100 ug via INTRAVENOUS

## 2023-10-03 MED ORDER — PHENYLEPHRINE 80 MCG/ML (10ML) SYRINGE FOR IV PUSH (FOR BLOOD PRESSURE SUPPORT)
PREFILLED_SYRINGE | INTRAVENOUS | Status: AC
  Filled 2023-10-03: qty 10

## 2023-10-03 MED ORDER — ACETAMINOPHEN 500 MG PO TABS
1000.0000 mg | ORAL_TABLET | Freq: Once | ORAL | Status: AC
  Administered 2023-10-03: 1000 mg via ORAL

## 2023-10-03 MED ORDER — LIDOCAINE 2% (20 MG/ML) 5 ML SYRINGE
INTRAMUSCULAR | Status: AC
  Filled 2023-10-03: qty 10

## 2023-10-03 MED ORDER — PROPOFOL 10 MG/ML IV BOLUS
INTRAVENOUS | Status: DC | PRN
  Administered 2023-10-03: 200 mg via INTRAVENOUS
  Administered 2023-10-03: 50 mg via INTRAVENOUS
  Administered 2023-10-03: 100 mg via INTRAVENOUS

## 2023-10-03 MED ORDER — DEXAMETHASONE SODIUM PHOSPHATE 10 MG/ML IJ SOLN
INTRAMUSCULAR | Status: DC | PRN
  Administered 2023-10-03: 10 mg via INTRAVENOUS

## 2023-10-03 MED ORDER — BUPIVACAINE HCL (PF) 0.5 % IJ SOLN
INTRAMUSCULAR | Status: AC
  Filled 2023-10-03: qty 30

## 2023-10-03 MED ORDER — HYDROMORPHONE HCL 1 MG/ML IJ SOLN
0.2500 mg | INTRAMUSCULAR | Status: DC | PRN
Start: 2023-10-03 — End: 2023-10-03

## 2023-10-03 MED ORDER — CHLORHEXIDINE GLUCONATE 0.12 % MT SOLN
OROMUCOSAL | Status: AC
  Filled 2023-10-03: qty 15

## 2023-10-03 MED ORDER — ROCURONIUM BROMIDE 10 MG/ML (PF) SYRINGE
PREFILLED_SYRINGE | INTRAVENOUS | Status: AC
  Filled 2023-10-03: qty 10

## 2023-10-03 MED ORDER — MIDAZOLAM HCL 2 MG/2ML IJ SOLN
INTRAMUSCULAR | Status: AC
  Filled 2023-10-03: qty 2

## 2023-10-03 MED ORDER — LACTATED RINGERS IV SOLN: INTRAVENOUS | Status: DC

## 2023-10-03 MED ORDER — SCOPOLAMINE 1 MG/3DAYS TD PT72
1.0000 | MEDICATED_PATCH | TRANSDERMAL | Status: DC
  Administered 2023-10-03: 1.5 mg via TRANSDERMAL
  Filled 2023-10-03: qty 1

## 2023-10-03 MED ORDER — LIDOCAINE 2% (20 MG/ML) 5 ML SYRINGE
INTRAMUSCULAR | Status: DC | PRN
  Administered 2023-10-03: 100 mg via INTRAVENOUS

## 2023-10-03 MED ORDER — MIDAZOLAM HCL 5 MG/5ML IJ SOLN
INTRAMUSCULAR | Status: DC | PRN
Start: 2023-10-03 — End: 2023-10-03
  Administered 2023-10-03: 2 mg via INTRAVENOUS

## 2023-10-03 MED ORDER — KETOROLAC TROMETHAMINE 30 MG/ML IJ SOLN
INTRAMUSCULAR | Status: DC | PRN
  Administered 2023-10-03: 30 mg via INTRAVENOUS

## 2023-10-03 MED ORDER — OXYCODONE HCL 5 MG PO TABS: 5.0000 mg | ORAL_TABLET | Freq: Once | ORAL | Status: DC | PRN

## 2023-10-03 MED ORDER — ONDANSETRON HCL 4 MG/2ML IJ SOLN
INTRAMUSCULAR | Status: DC | PRN
  Administered 2023-10-03: 4 mg via INTRAVENOUS

## 2023-10-03 MED ORDER — MIDAZOLAM HCL 2 MG/2ML IJ SOLN: 0.5000 mg | Freq: Once | INTRAMUSCULAR | Status: DC | PRN

## 2023-10-03 MED ORDER — MEPERIDINE HCL 25 MG/ML IJ SOLN: 6.2500 mg | INTRAMUSCULAR | Status: DC | PRN

## 2023-10-03 MED ORDER — FENTANYL CITRATE (PF) 250 MCG/5ML IJ SOLN
INTRAMUSCULAR | Status: AC
  Filled 2023-10-03: qty 5

## 2023-10-03 MED ORDER — DEXMEDETOMIDINE HCL IN NACL 80 MCG/20ML IV SOLN
INTRAVENOUS | Status: DC | PRN
  Administered 2023-10-03: 4 ug via INTRAVENOUS
  Administered 2023-10-03: 8 ug via INTRAVENOUS

## 2023-10-03 MED ORDER — ORAL CARE MOUTH RINSE: 15.0000 mL | Freq: Once | OROMUCOSAL | Status: AC

## 2023-10-03 MED ORDER — ROCURONIUM BROMIDE 10 MG/ML (PF) SYRINGE
PREFILLED_SYRINGE | INTRAVENOUS | Status: DC | PRN
  Administered 2023-10-03: 50 mg via INTRAVENOUS
  Administered 2023-10-03: 10 mg via INTRAVENOUS

## 2023-10-03 MED ORDER — OXYCODONE HCL 5 MG PO TABS: 5.0000 mg | ORAL_TABLET | ORAL | 0 refills | Status: DC | PRN

## 2023-10-03 MED ORDER — PHENYLEPHRINE 80 MCG/ML (10ML) SYRINGE FOR IV PUSH (FOR BLOOD PRESSURE SUPPORT)
PREFILLED_SYRINGE | INTRAVENOUS | Status: DC | PRN
  Administered 2023-10-03: 80 ug via INTRAVENOUS

## 2023-10-03 MED ORDER — SUGAMMADEX SODIUM 200 MG/2ML IV SOLN
INTRAVENOUS | Status: DC | PRN
  Administered 2023-10-03: 200 mg via INTRAVENOUS

## 2023-10-03 MED ORDER — 0.9 % SODIUM CHLORIDE (POUR BTL) OPTIME
TOPICAL | Status: DC | PRN
  Administered 2023-10-03: 1000 mL

## 2023-10-03 MED ORDER — CHLORHEXIDINE GLUCONATE 0.12 % MT SOLN
15.0000 mL | Freq: Once | OROMUCOSAL | Status: AC
  Administered 2023-10-03: 15 mL via OROMUCOSAL

## 2023-10-03 MED ORDER — BUPIVACAINE HCL (PF) 0.5 % IJ SOLN
INTRAMUSCULAR | Status: DC | PRN
  Administered 2023-10-03: 6 mL

## 2023-10-03 MED ORDER — OXYCODONE HCL 5 MG PO TABS: 5.0000 mg | ORAL_TABLET | ORAL | Status: DC | PRN

## 2023-10-03 MED ORDER — POVIDONE-IODINE 10 % EX SWAB: 2.0000 | Freq: Once | CUTANEOUS | Status: DC

## 2023-10-03 MED ORDER — PROPOFOL 10 MG/ML IV BOLUS
INTRAVENOUS | Status: AC
  Filled 2023-10-03: qty 20

## 2023-10-03 MED ORDER — OXYCODONE HCL 5 MG/5ML PO SOLN: 5.0000 mg | Freq: Once | ORAL | Status: DC | PRN

## 2023-10-03 MED ORDER — ACETAMINOPHEN 500 MG PO TABS
ORAL_TABLET | ORAL | Status: AC
  Filled 2023-10-03: qty 2

## 2023-10-03 SURGICAL SUPPLY — 31 items
APPLICATOR COTTON TIP 6 STRL (MISCELLANEOUS) IMPLANT
APPLICATOR COTTON TIP 6IN STRL (MISCELLANEOUS) ×2 IMPLANT
DERMABOND ADVANCED .7 DNX12 (GAUZE/BANDAGES/DRESSINGS) ×1 IMPLANT
DRAPE SURG IRRIG POUCH 19X23 (DRAPES) ×1 IMPLANT
DURAPREP 26ML APPLICATOR (WOUND CARE) ×1 IMPLANT
GLOVE BIOGEL PI IND STRL 6.5 (GLOVE) ×2 IMPLANT
GLOVE BIOGEL PI IND STRL 7.0 (GLOVE) ×1 IMPLANT
GLOVE ECLIPSE 6.5 STRL STRAW (GLOVE) ×1 IMPLANT
GLOVE SURG SYN 6.5 ES PF (GLOVE) ×2 IMPLANT
GLOVE SURG SYN 6.5 PF PI (GLOVE) IMPLANT
GOWN STRL REUS W/ TWL LRG LVL3 (GOWN DISPOSABLE) ×2 IMPLANT
KIT PINK PAD W/HEAD ARE REST (MISCELLANEOUS) ×1 IMPLANT
KIT PINK PAD W/HEAD ARM REST (MISCELLANEOUS) ×1 IMPLANT
KIT TURNOVER KIT B (KITS) ×1 IMPLANT
LIGASURE VESSEL 5MM BLUNT TIP (ELECTROSURGICAL) IMPLANT
NDL INSUFFLATION 14GA 120MM (NEEDLE) ×1 IMPLANT
NEEDLE INSUFFLATION 14GA 120MM (NEEDLE) ×1 IMPLANT
NS IRRIG 1000ML POUR BTL (IV SOLUTION) ×1 IMPLANT
PACK LAPAROSCOPY BASIN (CUSTOM PROCEDURE TRAY) ×1 IMPLANT
SET TUBE SMOKE EVAC HIGH FLOW (TUBING) ×1 IMPLANT
SLEEVE Z-THREAD 5X100MM (TROCAR) ×1 IMPLANT
SOL PREP POV-IOD 4OZ 10% (MISCELLANEOUS) IMPLANT
SPIKE FLUID TRANSFER (MISCELLANEOUS) IMPLANT
SUT VICRYL 0 UR6 27IN ABS (SUTURE) ×1 IMPLANT
SUT VICRYL RAPIDE 3 0 (SUTURE) ×1 IMPLANT
TOWEL GREEN STERILE FF (TOWEL DISPOSABLE) ×2 IMPLANT
TRAY FOLEY W/BAG SLVR 14FR (SET/KITS/TRAYS/PACK) ×1 IMPLANT
TROCAR 11X100 Z THREAD (TROCAR) ×1 IMPLANT
TROCAR 5MMX150MM (TROCAR) IMPLANT
TROCAR XCEL NON-BLD 5MMX100MML (ENDOMECHANICALS) ×1 IMPLANT
WARMER LAPAROSCOPE (MISCELLANEOUS) ×1 IMPLANT

## 2023-10-03 NOTE — H&P (Signed)
 33 y.o. G1P1001 presents for schedule surgery for desired permanent sterilization.  Past Medical History:  Diagnosis Date   Brain tumor (benign) (HCC)    DDD (degenerative disc disease), lumbar    Depression    Fibromyalgia    Generalized anxiety disorder    IBS (irritable bowel syndrome)    Insomnia    Migraines    Placental abruption    32 weeks   Pregnancy induced hypertension    Past Surgical History:  Procedure Laterality Date   BACK SURGERY     LUMBAR DISC SURGERY  06/21/2016   WISDOM TOOTH EXTRACTION      Social History   Socioeconomic History   Marital status: Single    Spouse name: Not on file   Number of children: Not on file   Years of education: Not on file   Highest education level: Not on file  Occupational History   Not on file  Tobacco Use   Smoking status: Never   Smokeless tobacco: Never  Vaping Use   Vaping status: Never Used  Substance and Sexual Activity   Alcohol use: No   Drug use: No   Sexual activity: Yes  Other Topics Concern   Not on file  Social History Narrative   Not on file   Social Drivers of Health   Financial Resource Strain: Not on file  Food Insecurity: Not on file  Transportation Needs: Not on file  Physical Activity: Not on file  Stress: Not on file  Social Connections: Unknown (11/17/2021)   Received from [facility]   Social Network    Social Network: Not on file  Intimate Partner Violence: Unknown (10/09/2021)   [facility]   HITS    Physically Hurt: Not on file    Insult or Talk Down To: Not on file    Threaten Physical Harm: Not on file    Scream or Curse: Not on file    No current facility-administered medications on file prior to encounter.   No current outpatient medications on file prior to encounter.    Allergies  Allergen Reactions   Erythromycin Hives   Penicillins Nausea And Vomiting    Has patient had a PCN reaction causing immediate rash,  facial/tongue/throat swelling, SOB or lightheadedness with hypotension: No Has patient had a PCN reaction causing severe rash involving mucus membranes or skin necrosis: No Has patient had a PCN reaction that required hospitalization No Has patient had a PCN reaction occurring within the last 10 years: Yes If all of the above answers are "NO", then may proceed with Cephalosporin use.    Clindamycin Hives   Tamiflu [Oseltamivir] Nausea And Vomiting   Latex Hives and Rash    Vitals:   09/21/23 1455 10/03/23 1038  BP:  (!) 135/96  Pulse:  (!) 106  Resp:  18  Temp:  99 F (37.2 C)  TempSrc:  Oral  SpO2:  96%  Weight: 123.4 kg 122.1 kg  Height: 5\' 10"  (1.778 m) 5\' 10"  (1.778 m)   Per recent office exam: Lungs: clear to ascultation Cor:  RRR Abdomen:  soft, nontender, nondistended. Ex:  no cords, erythema Pelvic:  deferred to OR  A:  Admit for scheduled diagnostic laparoscopy with bilateral salpingectomy  P: All risks, benefits and alternatives d/w patient and she desires to proceed.  We specifically discussed history of intracranial hypertension which patient states has remitted and she has not required antihypertensive medication.  We also discussed  her history of fibromyalgia and risk of post-surgical exacerbation. Patient will have SCDs during the operation.   No antibiotics needed. Other routine pre-op care. Heidi Gibbs

## 2023-10-03 NOTE — Discharge Instructions (Signed)

## 2023-10-03 NOTE — Discharge Instr - Supplementary Instructions (Signed)
 May take Ibuprofen,Advil,or Aleve after 7pm if needed for discomfort.

## 2023-10-03 NOTE — Anesthesia Procedure Notes (Addendum)
 Procedure Name: Intubation Date/Time: 10/03/2023 12:25 PM  Performed by: Bishop Limbo, CRNAPre-anesthesia Checklist: Patient identified, Suction available, Emergency Drugs available, Patient being monitored and Timeout performed Patient Re-evaluated:Patient Re-evaluated prior to induction Oxygen Delivery Method: Circle system utilized Preoxygenation: Pre-oxygenation with 100% oxygen Induction Type: IV induction Ventilation: Oral airway inserted - appropriate to patient size and Mask ventilation without difficulty Laryngoscope Size: Mac and 3 Grade View: Grade I Tube type: Oral Tube size: 7.0 mm Number of attempts: 1 Airway Equipment and Method: Patient positioned with wedge pillow, Stylet and Oral airway Placement Confirmation: ETT inserted through vocal cords under direct vision, positive ETCO2 and breath sounds checked- equal and bilateral Secured at: 23 cm Tube secured with: Tape Dental Injury: Teeth and Oropharynx as per pre-operative assessment  Comments: Performed by Sonda Rumble, Ludwig Clarks

## 2023-10-03 NOTE — Anesthesia Postprocedure Evaluation (Signed)
 Anesthesia Post Note  Patient: Heidi Gibbs  Procedure(s) Performed: SALPINGECTOMY, BILATERAL, LAPAROSCOPIC (Bilateral: Uterus)     Patient location during evaluation: PACU Anesthesia Type: General Level of consciousness: awake and alert, patient cooperative and oriented Pain management: pain level controlled Vital Signs Assessment: post-procedure vital signs reviewed and stable Respiratory status: spontaneous breathing, nonlabored ventilation and respiratory function stable Cardiovascular status: blood pressure returned to baseline and stable Postop Assessment: no apparent nausea or vomiting and able to ambulate Anesthetic complications: no   There were no known notable events for this encounter.  Last Vitals:  Vitals:   10/03/23 1400 10/03/23 1445  BP: 121/63 114/62  Pulse: 71 68  Resp: 13 20  Temp:    SpO2: 100% 100%    Last Pain:  Vitals:   10/03/23 1345  TempSrc:   PainSc: 0-No pain                 Willies Laviolette,E. Marquette Blodgett

## 2023-10-03 NOTE — Transfer of Care (Signed)
 Immediate Anesthesia Transfer of Care Note  Patient: Heidi Gibbs  Procedure(s) Performed: SALPINGECTOMY, BILATERAL, LAPAROSCOPIC (Bilateral: Uterus)  Patient Location: PACU  Anesthesia Type:General  Level of Consciousness: drowsy and responds to stimulation  Airway & Oxygen Therapy: Patient Spontanous Breathing and Patient connected to face mask oxygen  Post-op Assessment: Report given to RN and Post -op Vital signs reviewed and stable  Post vital signs: Reviewed and stable  Last Vitals:  Vitals Value Taken Time  BP 119/78 10/03/23 1330  Temp    Pulse 81 10/03/23 1334  Resp 14 10/03/23 1334  SpO2 99 % 10/03/23 1334  Vitals shown include unfiled device data.  Last Pain:  Vitals:   10/03/23 1038  TempSrc: Oral  PainSc: 3       Patients Stated Pain Goal: 7 (10/03/23 1038)  Complications: There were no known notable events for this encounter.

## 2023-10-03 NOTE — Op Note (Signed)
 10/03/2023  1:16 PM  PATIENT:  Heidi Gibbs  33 y.o. female  PRE-OPERATIVE DIAGNOSIS:  desired permanent sterilization  POST-OPERATIVE DIAGNOSIS:  desired permanent sterilization  PROCEDURE:  Procedure(s) with comments: SALPINGECTOMY, BILATERAL, LAPAROSCOPIC (Bilateral) - [facility]  SURGEON:  Surgeons and Role:    Philip Aspen, DO - Primary    * Willa Frater, MD - Assisting  ANESTHESIA:   local and general  EBL:  20 mL   LOCAL MEDICATIONS USED:  MARCAINE    and Amount: 0.25 ml  SPECIMEN:  Source of Specimen:  bilateral tubes  DISPOSITION OF SPECIMEN:  PATHOLOGY  COUNTS:  YES  PLAN OF CARE: Discharge to home after PACU  PATIENT DISPOSITION:  PACU - hemodynamically stable.   Delay start of Pharmacological VTE agent (>24hrs) due to surgical blood loss or risk of bleeding: not applicable  DESCRIPTION OF PROCEDURE: Patient was taken to the operating room where anesthesia was administered and found to be adequate.  She was prepped and draped in normal sterile fashion in dorsal lithotomy position.  A foley catheter was placed, and a single tooth uterine manipulator was advanced and the cervix grasped.    Attention was turned to the abdomen where marcaine was injected infraumbilically.  A 5 mm infraumbilical vertical incision was made.  A trocar was advanced and found to be too short to penetrate the peritoneum.  A longer trocar was then used and 3 attempts were made before confirmed entry into the abdomen.  The abdomen was insufflated and under direct visualization. Marcaine was placed subcutaneously, a 5mm incision was made with a scalpel, then 5mm trocars were placed in the left and right lower under direct visualization.  The abdomen was surveyed and no abnormalities noted.  The left tube was grasped and elevated and a Ligasure was used to transect the mesosalpinx distally up to the uterine serosa where it the tube itself was transected then removed under direct  visualization.  The same procedure was performed on the right.  Both tubes were sent to pathology.  The pelvis was observed and hemostasis confirmed.  All trocars were removed and the abdomen was desufflated.  The three 5mm incision were closed subcuticularly.  The uterine manipulator was removed.  Patient tolerated the procedure well.  Sponge, lap and needle counts were correct times 2.  Patient was taken to recovery in stable condition.

## 2023-10-03 NOTE — Anesthesia Preprocedure Evaluation (Addendum)
 Anesthesia Evaluation  Patient identified by MRN, date of birth, ID band Patient awake    Reviewed: Allergy & Precautions, NPO status , Patient's Chart, lab work & pertinent test results  History of Anesthesia Complications Negative for: history of anesthetic complications  Airway Mallampati: II  TM Distance: >3 FB Neck ROM: Full    Dental  (+) Dental Advisory Given   Pulmonary neg pulmonary ROS   breath sounds clear to auscultation       Cardiovascular hypertension, (-) angina  Rhythm:Regular Rate:Normal  H/o pregnancy induced HTN  '18 ECHO:  - Left ventricle: The cavity size was normal. mild LVH. Systolic function was normal.    EF 60% to 65%.    Wall motion was normal; no regional wall motion abnormalities. Left ventricular diastolic function parameters were normal.  - Atrial septum: No defect or patent foramen ovale was identified.  - Mitral valve: trivial MR    Neuro/Psych  Headaches  Anxiety Depression    negative neurological ROS     GI/Hepatic Neg liver ROS,GERD  Controlled,,  Endo/Other  BMI 38.6  Renal/GU negative Renal ROS     Musculoskeletal  (+)  Fibromyalgia -  Abdominal   Peds  Hematology negative hematology ROS (+)   Anesthesia Other Findings   Reproductive/Obstetrics                             Anesthesia Physical Anesthesia Plan  ASA: 2  Anesthesia Plan: General   Post-op Pain Management: Tylenol PO (pre-op)*   Induction: Intravenous  PONV Risk Score and Plan: 3 and Ondansetron, Dexamethasone, Scopolamine patch - Pre-op and Treatment may vary due to age or medical condition  Airway Management Planned: Oral ETT  Additional Equipment: None  Intra-op Plan:   Post-operative Plan: Extubation in OR  Informed Consent: I have reviewed the patients History and Physical, chart, labs and discussed the procedure including the risks, benefits and alternatives for  the proposed anesthesia with the patient or authorized representative who has indicated his/her understanding and acceptance.     Dental advisory given  Plan Discussed with: CRNA and Surgeon  Anesthesia Plan Comments:         Anesthesia Quick Evaluation

## 2023-10-04 ENCOUNTER — Encounter (HOSPITAL_COMMUNITY): Payer: Self-pay | Admitting: Obstetrics and Gynecology

## 2023-10-04 LAB — SURGICAL PATHOLOGY

## 2023-10-15 DIAGNOSIS — Z419 Encounter for procedure for purposes other than remedying health state, unspecified: Secondary | ICD-10-CM | POA: Diagnosis not present

## 2023-11-14 DIAGNOSIS — Z419 Encounter for procedure for purposes other than remedying health state, unspecified: Secondary | ICD-10-CM | POA: Diagnosis not present

## 2023-12-15 DIAGNOSIS — Z419 Encounter for procedure for purposes other than remedying health state, unspecified: Secondary | ICD-10-CM | POA: Diagnosis not present

## 2024-01-14 DIAGNOSIS — Z419 Encounter for procedure for purposes other than remedying health state, unspecified: Secondary | ICD-10-CM | POA: Diagnosis not present

## 2024-01-17 ENCOUNTER — Other Ambulatory Visit: Payer: Self-pay | Admitting: Medical Genetics

## 2024-01-18 ENCOUNTER — Other Ambulatory Visit (HOSPITAL_COMMUNITY)
Admission: RE | Admit: 2024-01-18 | Discharge: 2024-01-18 | Disposition: A | Discharge status: Home / Self Care | Payer: Self-pay | Source: Ambulatory Visit | Attending: Medical Genetics | Admitting: Medical Genetics

## 2024-01-27 LAB — GENECONNECT MOLECULAR SCREEN: Genetic Analysis Overall Interpretation: NEGATIVE

## 2024-02-14 DIAGNOSIS — Z419 Encounter for procedure for purposes other than remedying health state, unspecified: Secondary | ICD-10-CM | POA: Diagnosis not present

## 2024-03-16 DIAGNOSIS — Z419 Encounter for procedure for purposes other than remedying health state, unspecified: Secondary | ICD-10-CM | POA: Diagnosis not present

## 2024-04-11 ENCOUNTER — Encounter: Payer: Self-pay | Admitting: Family Medicine

## 2024-04-11 ENCOUNTER — Ambulatory Visit: Admitting: Family Medicine

## 2024-04-11 VITALS — BP 116/79 | HR 95 | Temp 98.1°F | Ht 70.0 in | Wt 261.0 lb

## 2024-04-11 DIAGNOSIS — F419 Anxiety disorder, unspecified: Secondary | ICD-10-CM

## 2024-04-11 DIAGNOSIS — Z23 Encounter for immunization: Secondary | ICD-10-CM | POA: Diagnosis not present

## 2024-04-11 DIAGNOSIS — F32A Depression, unspecified: Secondary | ICD-10-CM

## 2024-04-11 DIAGNOSIS — O10919 Unspecified pre-existing hypertension complicating pregnancy, unspecified trimester: Secondary | ICD-10-CM | POA: Insufficient documentation

## 2024-04-11 MED ORDER — SERTRALINE HCL 50 MG PO TABS: 50.0000 mg | ORAL_TABLET | Freq: Every day | ORAL | 5 refills | Status: DC

## 2024-04-11 NOTE — Patient Instructions (Signed)
 If your symptoms worsen or you have thoughts of suicide/homicide, PLEASE SEEK IMMEDIATE MEDICAL ATTENTION.  You may always call the National Suicide Hotline.  This is available 24 hours a day, 7 days a week.  Their number is: 860 108 4233  Taking the medicine as directed and not missing any doses is one of the best things you can do to treat your depression.  Here are some things to keep in mind:  Side effects (stomach upset, some increased anxiety) may happen before you notice a benefit.  These side effects typically go away over time. Changes to your dose of medicine or a change in medication all together is sometimes necessary Most people need to be on medication at least 6 months Many people will notice an improvement within two weeks but the full effect of the medication can take up to 4-6 weeks Stopping the medication when you start feeling better often results in a return of symptoms Never discontinue your medication without contacting a health care professional first.  Some medications require gradual discontinuation/ taper and can make you sick if you stop them abruptly.  If your symptoms worsen or you have thoughts of suicide/homicide, PLEASE SEEK IMMEDIATE MEDICAL ATTENTION.  You may always call:  National Suicide Hotline: 475 713 4818 [place] Crisis Line: 915-221-4757 Crisis Recovery in [place]: (276)833-9095  These are available 24 hours a day, 7 days a week.

## 2024-04-11 NOTE — Progress Notes (Signed)
 New Patient Office Visit  Subjective    Patient ID: Heidi Gibbs, female    DOB: Aug 07, 1990  Age: 33 y.o. MRN: [id number]  CC:  Chief Complaint  Patient presents with   Establish Care   Anxiety    Already diagnosed with anxiety. Would like to go back on her Anxiety medication (Zoloft ).    HPI Heidi Gibbs presents to establish care and to restart zoloft  for anxiety/depression.   Patient reports that she has a history of anxiety and depression and was taking zoloft  for about 2 years and stopped in 2023. Patient stopped medication because she left a stressful career and her anxiety was doing much better.    Denies any medication side effects to Zoloft .  Denies any SI.   Patient has a 71.49 year old child. This past June the patient's child had a febrile seizure and it has caused the patient anxiety. The mother reports that her child is doing well now but she still has anxiety anytime he gets sick and it has made it difficult for the mother to let herself let the patient go to preschool.   Hx of intracranial hypertension. Patient reports that it is controlled well with no problems. Is to f/u with neuro as needed.   Denies taking any medications.      04/11/2024    2:01 PM 12/14/2018    3:39 PM 11/15/2018    3:52 PM 06/21/2018    4:18 PM 05/26/2018    4:13 PM  Depression screen PHQ 2/9  Decreased Interest 1 0 0 0 0  Down, Depressed, Hopeless 1 0 0 0 0  PHQ - 2 Score 2 0 0 0 0  Altered sleeping 2 0     Tired, decreased energy 2 0     Change in appetite 1 0     Feeling bad or failure about yourself  0 0     Trouble concentrating 0 0     Moving slowly or fidgety/restless 0 0     Suicidal thoughts 0 0     PHQ-9 Score 7 0     Difficult doing work/chores Not difficult at all          04/11/2024    2:02 PM 04/21/2018    4:48 PM 03/15/2016    7:57 AM 11/11/2015    7:55 AM  GAD 7 : Generalized Anxiety Score  Nervous, Anxious, on Edge 2 0 0 3  Control/stop  worrying 2 0 0 3  Worry too much - different things 2 1 0 3  Trouble relaxing 2 0 0 3  Restless 1 0 0 3  Easily annoyed or irritable 1 0 0 3  Afraid - awful might happen 3 0 0 3  Total GAD 7 Score 13 1 0 21  Anxiety Difficulty Not difficult at all Not difficult at all Not difficult at all Very difficult      Outpatient Encounter Medications as of 04/11/2024  Medication Sig   sertraline  (ZOLOFT ) 50 MG tablet Take 1 tablet (50 mg total) by mouth daily.   [DISCONTINUED] oxyCODONE  (OXY IR/ROXICODONE ) 5 MG immediate release tablet Take 1 tablet (5 mg total) by mouth every 4 (four) hours as needed.   No facility-administered encounter medications on file as of 04/11/2024.    Past Medical History:  Diagnosis Date   Brain tumor (benign) (HCC)    DDD (degenerative disc disease), lumbar    Depression    Fibromyalgia    Generalized anxiety  disorder    IBS (irritable bowel syndrome)    Insomnia    Migraines    Placental abruption    32 weeks   Pregnancy induced hypertension     Past Surgical History:  Procedure Laterality Date   BACK SURGERY     LAPAROSCOPIC BILATERAL SALPINGECTOMY Bilateral 10/03/2023   Procedure: SALPINGECTOMY, BILATERAL, LAPAROSCOPIC;  Surgeon: Lilton Legions, DO;  Location: [facility] OR;  Service: Gynecology;  Laterality: Bilateral;  [facility]   LUMBAR DISC SURGERY  06/21/2016   TUBAL LIGATION     WISDOM TOOTH EXTRACTION      Family History  Problem Relation Age of Onset   Diabetes Mother    Hypertension Mother    Diabetes Father     Social History   Socioeconomic History   Marital status: Significant Other    Spouse name: Not on file   Number of children: Not on file   Years of education: Not on file   Highest education level: Bachelor's degree (e.g., BA, AB, BS)  Occupational History   Not on file  Tobacco Use   Smoking status: Never   Smokeless tobacco: Never  Vaping Use   Vaping status: Never Used  Substance and Sexual Activity   Alcohol use: No    Drug use: No   Sexual activity: Yes  Other Topics Concern   Not on file  Social History Narrative   Not on file   Social Drivers of Health   Financial Resource Strain: Low Risk  (04/09/2024)   Overall Financial Resource Strain (CARDIA)    Difficulty of Paying Living Expenses: Not hard at all  Food Insecurity: No Food Insecurity (04/09/2024)   Hunger Vital Sign    Worried About Running Out of Food in the Last Year: Never true    Ran Out of Food in the Last Year: Never true  Transportation Needs: No Transportation Needs (04/09/2024)   PRAPARE - Administrator, Civil Service (Medical): No    Lack of Transportation (Non-Medical): No  Physical Activity: Unknown (04/09/2024)   Exercise Vital Sign    Days of Exercise per Week: 2 days    Minutes of Exercise per Session: Not on file  Stress: Stress Concern Present (04/09/2024)   [employer] of Occupational Health - Occupational Stress Questionnaire    Feeling of Stress: Very much  Social Connections: Moderately Isolated (04/09/2024)   Social Connection and Isolation Panel    Frequency of Communication with Friends and Family: More than three times a week    Frequency of Social Gatherings with Friends and Family: Once a week    Attends Religious Services: Never    Database administrator or Organizations: No    Attends Engineer, structural: Not on file    Marital Status: Living with partner  Intimate Partner Violence: Unknown (10/09/2021)   Received from [facility]   HITS    Physically Hurt: Not on file    Insult or Talk Down To: Not on file    Threaten Physical Harm: Not on file    Scream or Curse: Not on file          Objective    BP 116/79   Pulse 95   Temp 98.1 F (36.7 C)   Ht 5' 10 (1.778 m)   Wt 261 lb (118.4 kg)   SpO2 100%   BMI 37.45 kg/m   Physical Exam Vitals reviewed.  Constitutional:      Appearance: Normal appearance.  HENT:  Head: Normocephalic and atraumatic.     Nose:  Nose normal.     Mouth/Throat:     Mouth: Mucous membranes are moist.     Pharynx: Oropharynx is clear.  Eyes:     Extraocular Movements: Extraocular movements intact.     Conjunctiva/sclera: Conjunctivae normal.     Pupils: Pupils are equal, round, and reactive to light.  Cardiovascular:     Rate and Rhythm: Normal rate and regular rhythm.     Pulses: Normal pulses.     Heart sounds: Normal heart sounds. No murmur heard. Pulmonary:     Effort: Pulmonary effort is normal.     Breath sounds: Normal breath sounds.  Musculoskeletal:        General: Normal range of motion.     Cervical back: Normal range of motion.  Skin:    General: Skin is warm and dry.  Neurological:     General: No focal deficit present.     Mental Status: She is alert and oriented to person, place, and time.  Psychiatric:        Mood and Affect: Mood normal.        Behavior: Behavior normal.         Assessment & Plan:   Problem List Items Addressed This Visit       Other   Depression   Relevant Medications   sertraline  (ZOLOFT ) 50 MG tablet   Other Visit Diagnoses       Anxiety    -  Primary   Relevant Medications   sertraline  (ZOLOFT ) 50 MG tablet      PHQ-9 and GAD-7 scores elevated today.  Restarted the patient on Zoloft  per her request in conjunction with her reported symptoms and elevated PHQ and GAD scores today.  Plan to start Zoloft  at 50 mg/day.  Follow-up in 2 weeks for reassessment or sooner for any side effects or concerns.  Discussed blackbox warning associated with SSRIs that they can increase suicidal ideations.  Discussed that if the patient has any SI thoughts or thoughts of self-harm to stop the medication and to immediately seek medical attention.  Patient to schedule annual wellness checkup.  Return in about 2 weeks (around 04/25/2024).   Oneil LELON Severin, FNP

## 2024-04-15 DIAGNOSIS — Z419 Encounter for procedure for purposes other than remedying health state, unspecified: Secondary | ICD-10-CM | POA: Diagnosis not present

## 2024-04-26 ENCOUNTER — Ambulatory Visit: Admitting: Family Medicine

## 2024-04-26 VITALS — BP 113/77 | HR 83 | Temp 97.9°F | Ht 70.0 in | Wt 252.0 lb

## 2024-04-26 DIAGNOSIS — F32A Depression, unspecified: Secondary | ICD-10-CM | POA: Diagnosis not present

## 2024-04-26 DIAGNOSIS — F419 Anxiety disorder, unspecified: Secondary | ICD-10-CM | POA: Diagnosis not present

## 2024-04-26 MED ORDER — SERTRALINE HCL 50 MG PO TABS: 50.0000 mg | ORAL_TABLET | Freq: Every day | ORAL | 0 refills | Status: DC

## 2024-04-26 MED ORDER — SERTRALINE HCL 25 MG PO TABS: 25.0000 mg | ORAL_TABLET | Freq: Every day | ORAL | 0 refills | Status: DC

## 2024-04-26 NOTE — Progress Notes (Signed)
 Acute Office Visit  Subjective:     Patient ID: Heidi Gibbs, female    DOB: 05/17/1991, 33 y.o.   MRN: [id number]  Chief Complaint  Patient presents with   2 Week Follow-up    Anxiety- restart Zoloft  medication  Patient reports very little change with her anxiety since restarting Zoloft  50mg  daily. Says the biggest change she has seen is loss of appetite and diarrhea.     HPI Patient is in today for anxiety and depression f/u.   04/11/2024 patient started on Zoloft  for anxiety and depression.  She reports today that she has not really seen a positive benefit yet.  She has had some decreased appetite and mild diarrhea but she states that the symptoms are manageable.  Denies any suicidal ideations.     04/26/2024    2:34 PM 04/11/2024    2:02 PM 04/21/2018    4:48 PM 03/15/2016    7:57 AM  GAD 7 : Generalized Anxiety Score  Nervous, Anxious, on Edge 1 2 0 0  Control/stop worrying 1 2 0 0  Worry too much - different things 1 2 1  0  Trouble relaxing 1 2 0 0  Restless 1 1 0 0  Easily annoyed or irritable 1 1 0 0  Afraid - awful might happen 1 3 0 0  Total GAD 7 Score 7 13 1  0  Anxiety Difficulty Somewhat difficult Not difficult at all Not difficult at all Not difficult at all      04/26/2024    2:33 PM 04/11/2024    2:01 PM 12/14/2018    3:39 PM 11/15/2018    3:52 PM 06/21/2018    4:18 PM  Depression screen PHQ 2/9  Decreased Interest 0 1 0 0 0  Down, Depressed, Hopeless 0 1 0 0 0  PHQ - 2 Score 0 2 0 0 0  Altered sleeping 2 2 0    Tired, decreased energy 0 2 0    Change in appetite 2 1 0    Feeling bad or failure about yourself  0 0 0    Trouble concentrating 0 0 0    Moving slowly or fidgety/restless 0 0 0    Suicidal thoughts 0 0 0    PHQ-9 Score 4 7 0    Difficult doing work/chores Not difficult at all Not difficult at all           ROS      Objective:    BP 113/77   Pulse 83   Temp 97.9 F (36.6 C)   Ht 5' 10 (1.778 Gibbs)   Wt 252 lb  (114.3 kg)   SpO2 98%   BMI 36.16 kg/Gibbs    Physical Exam Vitals reviewed.  Constitutional:      Appearance: Normal appearance.  HENT:     Head: Normocephalic and atraumatic.  Eyes:     Extraocular Movements: Extraocular movements intact.     Conjunctiva/sclera: Conjunctivae normal.     Pupils: Pupils are equal, round, and reactive to light.  Cardiovascular:     Rate and Rhythm: Normal rate and regular rhythm.     Pulses: Normal pulses.     Heart sounds: Normal heart sounds. No murmur heard. Pulmonary:     Effort: Pulmonary effort is normal. No respiratory distress.     Breath sounds: Normal breath sounds.  Musculoskeletal:        General: Normal range of motion.     Cervical back: Normal range of  motion.  Skin:    General: Skin is warm and dry.     Capillary Refill: Capillary refill takes less than 2 seconds.  Neurological:     General: No focal deficit present.     Mental Status: She is alert and oriented to person, place, and time.  Psychiatric:        Mood and Affect: Mood normal.        Behavior: Behavior normal.     No results found for any visits on 04/26/24.      Assessment & Plan:   Problem List Items Addressed This Visit       Other   Depression   Relevant Medications   sertraline  (ZOLOFT ) 25 MG tablet   sertraline  (ZOLOFT ) 50 MG tablet   Other Visit Diagnoses       Anxiety    -  Primary   Relevant Medications   sertraline  (ZOLOFT ) 25 MG tablet   sertraline  (ZOLOFT ) 50 MG tablet       Meds ordered this encounter  Medications   sertraline  (ZOLOFT ) 25 MG tablet    Sig: Take 1 tablet (25 mg total) by mouth daily. Take 25 mg tablet with 50 mg zoloft  tablet for a total dose of 75 mg/day    Dispense:  30 tablet    Refill:  0    Supervising Provider:   GOTTSCHALK, ASHLY Gibbs [[id number]]   sertraline  (ZOLOFT ) 50 MG tablet    Sig: Take 1 tablet (50 mg total) by mouth daily. Take 50 mg tablet with 25 mg zoloft  tablet for a total dose of 75 mg/day     Dispense:  30 tablet    Refill:  0    Supervising Provider:   JOLINDA NORENE Gibbs [[id number]]   Patient seen today for anxiety and depression follow-up after being started on Zoloft  04/11/2024.  Patient reports side effect such as mild diarrhea and some decreased appetite.  We discussed that these side effects will hopefully decrease with time. We discussed that SSRIs can take 2-6 weeks to see positive benefit.   Plan to increase Zoloft  dose to 75 mg/day.   Follow-up in 1 to 2 weeks for reevaluation or sooner for any new side effects, worsening side effects, or for any other concerns.   Return in about 2 weeks (around 05/10/2024) for can follow up via mychart in 1-2 weeks.  Heidi LELON Severin, FNP

## 2024-05-03 ENCOUNTER — Other Ambulatory Visit: Payer: Self-pay | Admitting: Family Medicine

## 2024-05-03 DIAGNOSIS — F419 Anxiety disorder, unspecified: Secondary | ICD-10-CM

## 2024-05-08 ENCOUNTER — Other Ambulatory Visit: Payer: Self-pay | Admitting: Family Medicine

## 2024-05-08 ENCOUNTER — Encounter: Payer: Self-pay | Admitting: Family Medicine

## 2024-05-08 DIAGNOSIS — F419 Anxiety disorder, unspecified: Secondary | ICD-10-CM

## 2024-05-08 DIAGNOSIS — F32A Depression, unspecified: Secondary | ICD-10-CM

## 2024-05-08 DIAGNOSIS — R21 Rash and other nonspecific skin eruption: Secondary | ICD-10-CM

## 2024-05-08 MED ORDER — TRIAMCINOLONE ACETONIDE 0.1 % EX CREA: 1.0000 | TOPICAL_CREAM | Freq: Two times a day (BID) | CUTANEOUS | 0 refills | Status: AC | PRN

## 2024-05-08 MED ORDER — SERTRALINE HCL 100 MG PO TABS: 100.0000 mg | ORAL_TABLET | Freq: Every day | ORAL | 2 refills | Status: DC

## 2024-05-08 NOTE — Progress Notes (Unsigned)
 Called and discussed the rash on the patient's neck with her.  She says it has been present for about 2 weeks.  She says that it does itch and burn a little bit.  Discussed that it could be shingles or poison ivy dermatitis.  Outside window for antiviral.  Ordered Kenalog 0.1% twice daily to trial.  Discussed continued observation and to follow-up if symptoms worsen or do not improve over next 3-5 days.

## 2024-05-30 ENCOUNTER — Other Ambulatory Visit: Payer: Self-pay | Admitting: Family Medicine

## 2024-05-30 DIAGNOSIS — F419 Anxiety disorder, unspecified: Secondary | ICD-10-CM

## 2024-05-30 DIAGNOSIS — F32A Depression, unspecified: Secondary | ICD-10-CM

## 2024-07-12 ENCOUNTER — Ambulatory Visit: Admitting: Family Medicine

## 2024-07-12 VITALS — BP 105/70 | HR 80 | Temp 97.9°F | Ht 70.0 in | Wt 240.0 lb

## 2024-07-12 DIAGNOSIS — F32A Depression, unspecified: Secondary | ICD-10-CM | POA: Diagnosis not present

## 2024-07-12 DIAGNOSIS — F419 Anxiety disorder, unspecified: Secondary | ICD-10-CM

## 2024-07-12 DIAGNOSIS — Z23 Encounter for immunization: Secondary | ICD-10-CM

## 2024-07-12 MED ORDER — SERTRALINE HCL 150 MG PO CAPS: 150.0000 mg | ORAL_CAPSULE | Freq: Every day | ORAL | 2 refills | Status: DC

## 2024-07-12 NOTE — Progress Notes (Signed)
 "  Acute Office Visit  Patient ID: Heidi Gibbs, female    DOB: 17-Feb-1991, 34 y.o.   MRN: [id number]  PCP: Alcus Oneil ORN, FNP  Chief Complaint  Patient presents with   MED FOLLOW UP    Patient believes her Zoloft  100mg  Rx needs to be increased. Patient says she has noticed that she has been feeling more down than normal lately.     Subjective:     HPI  Discussed the use of AI scribe software for clinical note transcription with the patient, who gave verbal consent to proceed.  History of Present Illness   Heidi Gibbs is a 34 year old female who presents with worsening depression and improved anxiety.  Depressive symptoms - Worsening depressive symptoms over recent weeks - Denies any outside stimuli worsening symptoms.  - No suicidal ideation - Works part-time for a couple of hours a day - Requesting to increase zoloft  dose.   Anxiety symptoms - Significant improvement in anxiety levels since restarting zoloft .  - Remained calm during son's recent ear infections (patient reports that her son getting sick was a previous provoking factor for anxiety due to febrile seizure scare in the past)   Immunization status inquiry - Received second HPV vaccine dose in October - Inquires about timing for third HPV vaccine dose         07/12/2024   11:46 AM 04/26/2024    2:34 PM 04/11/2024    2:02 PM 04/21/2018    4:48 PM  GAD 7 : Generalized Anxiety Score  Nervous, Anxious, on Edge 1 1 2  0  Control/stop worrying 1 1 2  0  Worry too much - different things 1 1 2 1   Trouble relaxing 1 1 2  0  Restless 1 1 1  0  Easily annoyed or irritable 1 1 1  0  Afraid - awful might happen 1 1 3  0  Total GAD 7 Score 7 7 13 1   Anxiety Difficulty Somewhat difficult Somewhat difficult Not difficult at all Not difficult at all       07/12/2024   11:46 AM 04/26/2024    2:33 PM 04/11/2024    2:01 PM 12/14/2018    3:39 PM 11/15/2018    3:52 PM  Depression screen PHQ 2/9  Decreased  Interest 1 0 1 0 0  Down, Depressed, Hopeless 1 0 1 0 0  PHQ - 2 Score 2 0 2 0 0  Altered sleeping 2 2 2  0   Tired, decreased energy 2 0 2 0   Change in appetite 2 2 1  0   Feeling bad or failure about yourself  0 0 0 0   Trouble concentrating 1 0 0 0   Moving slowly or fidgety/restless 0 0 0 0   Suicidal thoughts 0 0 0 0   PHQ-9 Score 9 4  7   0    Difficult doing work/chores Somewhat difficult Not difficult at all Not difficult at all       Data saved with a previous flowsheet row definition     ROS     Objective:    BP 105/70 (Cuff Size: Normal)   Pulse 80   Temp 97.9 F (36.6 C)   Ht 5' 10 (1.778 m)   Wt 240 lb (108.9 kg)   SpO2 96%   BMI 34.44 kg/m    Physical Exam Vitals reviewed.  Constitutional:      Appearance: Normal appearance.  HENT:     Head: Normocephalic and atraumatic.  Eyes:     Extraocular Movements: Extraocular movements intact.     Conjunctiva/sclera: Conjunctivae normal.     Pupils: Pupils are equal, round, and reactive to light.  Cardiovascular:     Rate and Rhythm: Normal rate and regular rhythm.     Pulses: Normal pulses.     Heart sounds: Normal heart sounds. No murmur heard. Pulmonary:     Effort: Pulmonary effort is normal. No respiratory distress.     Breath sounds: Normal breath sounds.  Musculoskeletal:        General: No deformity. Normal range of motion.     Cervical back: Normal range of motion.  Skin:    General: Skin is warm and dry.  Neurological:     General: No focal deficit present.     Mental Status: She is alert and oriented to person, place, and time.  Psychiatric:        Mood and Affect: Mood normal.        Behavior: Behavior normal.       No results found for any visits on 07/12/24.     Assessment & Plan:   Problem List Items Addressed This Visit       Other   Depression - Primary   Relevant Medications   Sertraline  HCl 150 MG CAPS   Other Visit Diagnoses       Anxiety       Relevant  Medications   Sertraline  HCl 150 MG CAPS     Immunization due       Relevant Orders   HPV 9-valent vaccine,Recombinat (Completed)       Assessment and Plan    Depressive disorder Depressive symptoms worsened slightly. No suicidal ideation.  - Increased sertraline  to 150 mg daily. - Instructed to monitor response and report issues.  Anxiety disorder Anxiety symptoms improved. Sertraline  effective for anxiety control. - Continue sertraline .  General health maintenance - Ordered third dose of HPV vaccine.        Meds ordered this encounter  Medications   Sertraline  HCl 150 MG CAPS    Sig: Take 1 capsule (150 mg total) by mouth daily.    Dispense:  30 capsule    Refill:  2    Supervising Provider:   JOLINDA NORENE HERO [[id number]]    Return in about 3 months (around 10/10/2024).  Oneil LELON Severin, FNP [facility]   "

## 2024-07-16 ENCOUNTER — Other Ambulatory Visit (HOSPITAL_COMMUNITY): Payer: Self-pay

## 2024-07-16 ENCOUNTER — Telehealth: Payer: Self-pay

## 2024-07-16 NOTE — Telephone Encounter (Signed)
 Pharmacy Patient Advocate Encounter   Received notification from Physician's Office that prior authorization for Sertraline  HCl 150MG  capsules is required/requested.   Insurance verification completed.   The patient is insured through [place] MEDICAID.   Per test claim: PA required; PA started via CoverMyMeds. KEY [id number] . Waiting for clinical questions to populate.

## 2024-07-19 ENCOUNTER — Other Ambulatory Visit: Payer: Self-pay | Admitting: Family Medicine

## 2024-07-19 DIAGNOSIS — F411 Generalized anxiety disorder: Secondary | ICD-10-CM

## 2024-07-19 MED ORDER — SERTRALINE HCL 100 MG PO TABS: 100.0000 mg | ORAL_TABLET | Freq: Every day | ORAL | 2 refills | Status: AC

## 2024-07-19 MED ORDER — SERTRALINE HCL 50 MG PO TABS: 50.0000 mg | ORAL_TABLET | Freq: Every day | ORAL | 2 refills | Status: AC

## 2024-07-19 NOTE — Telephone Encounter (Signed)
 Pharmacy Patient Advocate Encounter  Received notification from [facility] MEDICAID that Prior Authorization for Sertraline  HCl 150MG  capsules  has been DENIED.  Full denial letter will be uploaded to the media tab. See denial reason below.  Requires step therapy with at least 2 preferred alternatives: citalopram, escitalopram, fluoxetine, fluvoxamine, paroxetine, Paxil suspension, sertraline  solution, or sertraline  tablet.   PA #/Case ID/Reference #: [id number]

## 2024-07-19 NOTE — Telephone Encounter (Signed)
 Mychart message sent to notify pt. Heidi Gibbs

## 2024-10-11 ENCOUNTER — Encounter: Admitting: Family Medicine
